# Patient Record
Sex: Female | Born: 1970 | Race: White | Hispanic: No | Marital: Married | Smoking: Never smoker
Health system: Southern US, Community
[De-identification: ages and names within clinical notes are randomized; demographics above are authoritative.]

## PROBLEM LIST (undated history)

## (undated) DIAGNOSIS — E079 Disorder of thyroid, unspecified: Secondary | ICD-10-CM

## (undated) DIAGNOSIS — M1990 Unspecified osteoarthritis, unspecified site: Secondary | ICD-10-CM

## (undated) HISTORY — PX: WRIST SURGERY: SHX841

## (undated) HISTORY — PX: TYMPANOSTOMY TUBE PLACEMENT: SHX32

## (undated) HISTORY — DX: Disorder of thyroid, unspecified: E07.9

## (undated) HISTORY — PX: DILATION AND CURETTAGE OF UTERUS: SHX78

---

## 1998-05-22 ENCOUNTER — Other Ambulatory Visit: Admission: RE | Admit: 1998-05-22 | Discharge: 1998-05-22 | Payer: Self-pay | Admitting: Gynecology

## 1999-06-20 ENCOUNTER — Other Ambulatory Visit: Admission: RE | Admit: 1999-06-20 | Discharge: 1999-06-20 | Payer: Self-pay | Admitting: Gynecology

## 1999-09-26 ENCOUNTER — Other Ambulatory Visit: Admission: RE | Admit: 1999-09-26 | Discharge: 1999-09-26 | Payer: Self-pay | Admitting: Gynecology

## 1999-12-25 ENCOUNTER — Encounter: Payer: Self-pay | Admitting: Family Medicine

## 1999-12-25 ENCOUNTER — Encounter: Admission: RE | Admit: 1999-12-25 | Discharge: 1999-12-25 | Payer: Self-pay | Admitting: Family Medicine

## 2000-05-27 ENCOUNTER — Other Ambulatory Visit: Admission: RE | Admit: 2000-05-27 | Discharge: 2000-05-27 | Payer: Self-pay | Admitting: Obstetrics and Gynecology

## 2001-08-03 ENCOUNTER — Other Ambulatory Visit: Admission: RE | Admit: 2001-08-03 | Discharge: 2001-08-03 | Payer: Self-pay | Admitting: Obstetrics and Gynecology

## 2002-08-24 ENCOUNTER — Other Ambulatory Visit: Admission: RE | Admit: 2002-08-24 | Discharge: 2002-08-24 | Payer: Self-pay | Admitting: Obstetrics and Gynecology

## 2003-09-04 ENCOUNTER — Other Ambulatory Visit: Admission: RE | Admit: 2003-09-04 | Discharge: 2003-09-04 | Payer: Self-pay | Admitting: Obstetrics and Gynecology

## 2004-01-04 ENCOUNTER — Other Ambulatory Visit: Admission: RE | Admit: 2004-01-04 | Discharge: 2004-01-04 | Payer: Self-pay | Admitting: Obstetrics and Gynecology

## 2004-09-02 ENCOUNTER — Inpatient Hospital Stay (HOSPITAL_COMMUNITY): Admission: AD | Admit: 2004-09-02 | Discharge: 2004-09-02 | Payer: Self-pay | Admitting: Obstetrics and Gynecology

## 2004-10-21 ENCOUNTER — Other Ambulatory Visit: Admission: RE | Admit: 2004-10-21 | Discharge: 2004-10-21 | Payer: Self-pay | Admitting: Obstetrics and Gynecology

## 2005-04-30 ENCOUNTER — Other Ambulatory Visit: Admission: RE | Admit: 2005-04-30 | Discharge: 2005-04-30 | Payer: Self-pay | Admitting: Obstetrics and Gynecology

## 2005-08-10 ENCOUNTER — Inpatient Hospital Stay (HOSPITAL_COMMUNITY): Admission: AD | Admit: 2005-08-10 | Discharge: 2005-08-10 | Payer: Self-pay | Admitting: Obstetrics and Gynecology

## 2005-08-11 ENCOUNTER — Inpatient Hospital Stay (HOSPITAL_COMMUNITY): Admission: AD | Admit: 2005-08-11 | Discharge: 2005-08-11 | Payer: Self-pay | Admitting: Obstetrics and Gynecology

## 2005-08-15 ENCOUNTER — Inpatient Hospital Stay (HOSPITAL_COMMUNITY): Admission: AD | Admit: 2005-08-15 | Discharge: 2005-08-16 | Payer: Self-pay | Admitting: Obstetrics and Gynecology

## 2005-10-26 ENCOUNTER — Inpatient Hospital Stay (HOSPITAL_COMMUNITY): Admission: AD | Admit: 2005-10-26 | Discharge: 2005-10-26 | Payer: Self-pay | Admitting: Obstetrics and Gynecology

## 2005-10-28 ENCOUNTER — Inpatient Hospital Stay (HOSPITAL_COMMUNITY): Admission: AD | Admit: 2005-10-28 | Discharge: 2005-10-28 | Payer: Self-pay | Admitting: Obstetrics and Gynecology

## 2005-11-05 ENCOUNTER — Inpatient Hospital Stay (HOSPITAL_COMMUNITY): Admission: AD | Admit: 2005-11-05 | Discharge: 2005-11-08 | Payer: Self-pay | Admitting: Obstetrics & Gynecology

## 2005-12-17 ENCOUNTER — Other Ambulatory Visit: Admission: RE | Admit: 2005-12-17 | Discharge: 2005-12-17 | Payer: Self-pay | Admitting: Obstetrics and Gynecology

## 2010-07-08 ENCOUNTER — Inpatient Hospital Stay (HOSPITAL_COMMUNITY)
Admission: AD | Admit: 2010-07-08 | Discharge: 2010-07-10 | Payer: Self-pay | Source: Home / Self Care | Admitting: Obstetrics and Gynecology

## 2010-12-26 LAB — CBC
HCT: 34.3 % — ABNORMAL LOW (ref 36.0–46.0)
HCT: 40.3 % (ref 36.0–46.0)
Hemoglobin: 12 g/dL (ref 12.0–15.0)
Hemoglobin: 13.6 g/dL (ref 12.0–15.0)
MCH: 32.1 pg (ref 26.0–34.0)
MCH: 33.6 pg (ref 26.0–34.0)
MCHC: 33.8 g/dL (ref 30.0–36.0)
MCHC: 34.9 g/dL (ref 30.0–36.0)
MCV: 95.1 fL (ref 78.0–100.0)
MCV: 96.3 fL (ref 78.0–100.0)
Platelets: 168 10*3/uL (ref 150–400)
Platelets: 179 10*3/uL (ref 150–400)
RBC: 3.57 MIL/uL — ABNORMAL LOW (ref 3.87–5.11)
RBC: 4.24 MIL/uL (ref 3.87–5.11)
RDW: 13.7 % (ref 11.5–15.5)
RDW: 13.8 % (ref 11.5–15.5)
WBC: 10 10*3/uL (ref 4.0–10.5)
WBC: 11.9 10*3/uL — ABNORMAL HIGH (ref 4.0–10.5)

## 2010-12-26 LAB — RPR: RPR Ser Ql: NONREACTIVE

## 2017-05-27 DIAGNOSIS — D225 Melanocytic nevi of trunk: Secondary | ICD-10-CM | POA: Diagnosis not present

## 2017-05-27 DIAGNOSIS — Z85828 Personal history of other malignant neoplasm of skin: Secondary | ICD-10-CM | POA: Diagnosis not present

## 2017-05-27 DIAGNOSIS — D1800 Hemangioma unspecified site: Secondary | ICD-10-CM | POA: Diagnosis not present

## 2017-06-29 DIAGNOSIS — M67432 Ganglion, left wrist: Secondary | ICD-10-CM | POA: Diagnosis not present

## 2017-07-06 DIAGNOSIS — M67432 Ganglion, left wrist: Secondary | ICD-10-CM | POA: Diagnosis not present

## 2017-07-29 DIAGNOSIS — Z23 Encounter for immunization: Secondary | ICD-10-CM | POA: Diagnosis not present

## 2017-10-26 DIAGNOSIS — Z01419 Encounter for gynecological examination (general) (routine) without abnormal findings: Secondary | ICD-10-CM | POA: Diagnosis not present

## 2017-10-26 DIAGNOSIS — Z1231 Encounter for screening mammogram for malignant neoplasm of breast: Secondary | ICD-10-CM | POA: Diagnosis not present

## 2017-10-26 DIAGNOSIS — Z6821 Body mass index (BMI) 21.0-21.9, adult: Secondary | ICD-10-CM | POA: Diagnosis not present

## 2017-12-30 DIAGNOSIS — M67432 Ganglion, left wrist: Secondary | ICD-10-CM | POA: Diagnosis not present

## 2017-12-31 ENCOUNTER — Other Ambulatory Visit: Payer: Self-pay | Admitting: Orthopedic Surgery

## 2018-02-25 ENCOUNTER — Ambulatory Visit (HOSPITAL_BASED_OUTPATIENT_CLINIC_OR_DEPARTMENT_OTHER): Admit: 2018-02-25 | Payer: Self-pay | Admitting: Orthopedic Surgery

## 2018-02-25 ENCOUNTER — Encounter (HOSPITAL_BASED_OUTPATIENT_CLINIC_OR_DEPARTMENT_OTHER): Payer: Self-pay

## 2018-02-25 SURGERY — EXCISION, GANGLION CYST, WRIST
Anesthesia: Choice | Laterality: Left

## 2018-05-25 DIAGNOSIS — D1801 Hemangioma of skin and subcutaneous tissue: Secondary | ICD-10-CM | POA: Diagnosis not present

## 2018-05-25 DIAGNOSIS — I781 Nevus, non-neoplastic: Secondary | ICD-10-CM | POA: Diagnosis not present

## 2018-05-25 DIAGNOSIS — Z85828 Personal history of other malignant neoplasm of skin: Secondary | ICD-10-CM | POA: Diagnosis not present

## 2018-06-18 ENCOUNTER — Ambulatory Visit (INDEPENDENT_AMBULATORY_CARE_PROVIDER_SITE_OTHER): Payer: BLUE CROSS/BLUE SHIELD

## 2018-06-18 ENCOUNTER — Ambulatory Visit (INDEPENDENT_AMBULATORY_CARE_PROVIDER_SITE_OTHER): Payer: BLUE CROSS/BLUE SHIELD | Admitting: Sports Medicine

## 2018-06-18 ENCOUNTER — Encounter: Payer: Self-pay | Admitting: Sports Medicine

## 2018-06-18 ENCOUNTER — Ambulatory Visit: Payer: Self-pay

## 2018-06-18 VITALS — BP 120/82 | HR 62 | Ht 64.0 in | Wt 127.6 lb

## 2018-06-18 DIAGNOSIS — M25551 Pain in right hip: Secondary | ICD-10-CM | POA: Diagnosis not present

## 2018-06-18 MED ORDER — IBUPROFEN-FAMOTIDINE 800-26.6 MG PO TABS: 1.0000 | ORAL_TABLET | Freq: Three times a day (TID) | ORAL | 2 refills | Status: DC | PRN

## 2018-06-18 MED ORDER — IBUPROFEN-FAMOTIDINE 800-26.6 MG PO TABS: 1.0000 | ORAL_TABLET | Freq: Three times a day (TID) | ORAL | 0 refills | Status: DC | PRN

## 2018-06-18 NOTE — Patient Instructions (Addendum)
We are ordering an MRI for you today.  The imaging office will be calling you to schedule your appointment after we obtain authorization from your insurance company.   Please be sure you have signed up for MyChart so that we can get your results to you.  We will be in touch with you as soon as we can.  Please know, it can take up to 3-4 business days for the radiologist and Dr. Rigby to have time to review the results and determine the best appropriate action.  If there is something that appears to be surgical or needs a referral to other specialists we will let you know through MyChart or telephone.  Otherwise we will plan to schedule a follow up appointment with Dr. Rigby once we have the results.    [facility]: [phone]  
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
## 2018-06-21 ENCOUNTER — Telehealth: Payer: Self-pay

## 2018-06-21 NOTE — Telephone Encounter (Signed)
Pt called and left VM asking about MR arthrogram, she has not heard back about scheduling.   I left VM at [facility] Friday and have not heard back. -BSC.

## 2018-06-21 NOTE — Telephone Encounter (Signed)
Spoke with Jacqueline Stout at [facility], they can schedule for next Monday. Called pt and advised. She will await call from [place].

## 2018-06-28 ENCOUNTER — Ambulatory Visit (INDEPENDENT_AMBULATORY_CARE_PROVIDER_SITE_OTHER): Payer: BLUE CROSS/BLUE SHIELD

## 2018-06-28 ENCOUNTER — Ambulatory Visit (INDEPENDENT_AMBULATORY_CARE_PROVIDER_SITE_OTHER): Payer: BLUE CROSS/BLUE SHIELD | Admitting: Sports Medicine

## 2018-06-28 DIAGNOSIS — M25551 Pain in right hip: Secondary | ICD-10-CM

## 2018-06-28 DIAGNOSIS — M12851 Other specific arthropathies, not elsewhere classified, right hip: Secondary | ICD-10-CM | POA: Diagnosis not present

## 2018-06-28 MED ORDER — GADOBENATE DIMEGLUMINE 529 MG/ML IV SOLN
5.0000 mL | Freq: Once | INTRAVENOUS | Status: AC | PRN
  Administered 2018-06-28: 1 mL via INTRA_ARTICULAR

## 2018-06-28 NOTE — Progress Notes (Signed)
   Procedure: Real-time Ultrasound Guided gadolinium contrast injection of right femoroacetabular joint Device: GE Logiq E  Verbal informed consent obtained.  Time-out conducted.  Noted no overlying erythema, induration, or other signs of local infection.  Skin prepped in a sterile fashion.  Local anesthesia: Topical Ethyl chloride.  With sterile technique and under real time ultrasound guidance: Using a 22-gauge spinal needle advanced to the femoral head/neck junction, contacted bone and injected 2 cc lidocaine, 2 cc bupivacaine, syringe switched and 0.1 cc gadolinium injected, syringe again switched and 5 cc Isovue injected, syringe switched again and 8 mL of sterile saline injected to flush the needle. Joint visualized and capsule seen distending confirming intra-articular placement of contrast material and medication. Completed without difficulty  Advised to call if fevers/chills, erythema, induration, drainage, or persistent bleeding.  Images permanently stored and available for review in the ultrasound unit.  Impression: Technically successful ultrasound guided gadolinium contrast injection for MR arthrography.  Please see separate MR arthrogram report.

## 2018-06-28 NOTE — Assessment & Plan Note (Signed)
Injection procedure as above for MR arthrogram. Further management per primary treating provider.

## 2018-06-29 ENCOUNTER — Encounter: Payer: Self-pay | Admitting: Sports Medicine

## 2018-06-29 DIAGNOSIS — E039 Hypothyroidism, unspecified: Secondary | ICD-10-CM | POA: Insufficient documentation

## 2018-07-01 ENCOUNTER — Encounter: Payer: Self-pay | Admitting: Sports Medicine

## 2018-07-01 ENCOUNTER — Ambulatory Visit: Payer: BLUE CROSS/BLUE SHIELD | Admitting: Sports Medicine

## 2018-07-01 VITALS — BP 120/70 | HR 77 | Ht 64.0 in | Wt 126.6 lb

## 2018-07-01 DIAGNOSIS — M25551 Pain in right hip: Secondary | ICD-10-CM | POA: Diagnosis not present

## 2018-07-01 DIAGNOSIS — M84359A Stress fracture, hip, unspecified, initial encounter for fracture: Secondary | ICD-10-CM

## 2018-07-01 NOTE — Progress Notes (Signed)
See office visit note.  She is nonweightbearing on crutches.
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
## 2018-07-15 ENCOUNTER — Ambulatory Visit: Payer: BLUE CROSS/BLUE SHIELD | Admitting: Sports Medicine

## 2018-07-15 ENCOUNTER — Encounter: Payer: Self-pay | Admitting: Sports Medicine

## 2018-07-15 VITALS — BP 110/80 | HR 66 | Ht 64.0 in | Wt 128.8 lb

## 2018-07-15 DIAGNOSIS — M84359A Stress fracture, hip, unspecified, initial encounter for fracture: Secondary | ICD-10-CM | POA: Diagnosis not present

## 2018-07-15 DIAGNOSIS — M25551 Pain in right hip: Secondary | ICD-10-CM

## 2018-07-15 NOTE — Patient Instructions (Signed)
Vitamin D3, 5000 units daily is typically safe to take. If you take it long term it is recommended to check your levels to ensure you remain in a therapeutic range. Be good to make sure you are getting up to 1500 mg of calcium per day.  One Tums has 500 mg of calcium carbonate which equals approximately 200 mg of dietary calcium. It would probably be good to take 2-4 tablets on a daily basis in addition to the vitamin D3.
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
## 2018-07-28 DIAGNOSIS — Z23 Encounter for immunization: Secondary | ICD-10-CM | POA: Diagnosis not present

## 2018-07-29 ENCOUNTER — Encounter: Payer: Self-pay | Admitting: Sports Medicine

## 2018-07-29 ENCOUNTER — Ambulatory Visit: Payer: BLUE CROSS/BLUE SHIELD | Admitting: Sports Medicine

## 2018-07-29 ENCOUNTER — Ambulatory Visit (INDEPENDENT_AMBULATORY_CARE_PROVIDER_SITE_OTHER): Payer: BLUE CROSS/BLUE SHIELD

## 2018-07-29 VITALS — BP 118/78 | HR 66 | Ht 64.0 in | Wt 128.8 lb

## 2018-07-29 DIAGNOSIS — M25551 Pain in right hip: Secondary | ICD-10-CM

## 2018-07-29 DIAGNOSIS — M1611 Unilateral primary osteoarthritis, right hip: Secondary | ICD-10-CM | POA: Diagnosis not present

## 2018-07-29 DIAGNOSIS — M84359A Stress fracture, hip, unspecified, initial encounter for fracture: Secondary | ICD-10-CM | POA: Diagnosis not present
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
## 2018-07-29 NOTE — Assessment & Plan Note (Signed)
She is doing significantly better.  No pain with day-to-day activities while using crutches.  We will have her transition off the crutches and continue with minimal activity over the next 4 weeks.  Will plan to repeat x-rays at follow-up as well as consider further repeat advanced diagnostic imaging with MRI of the hip either at the 8 or 12-week mark if any symptoms

## 2018-08-26 ENCOUNTER — Encounter: Payer: Self-pay | Admitting: Sports Medicine

## 2018-08-26 ENCOUNTER — Ambulatory Visit: Payer: BLUE CROSS/BLUE SHIELD | Admitting: Sports Medicine

## 2018-08-26 ENCOUNTER — Ambulatory Visit (INDEPENDENT_AMBULATORY_CARE_PROVIDER_SITE_OTHER): Payer: BLUE CROSS/BLUE SHIELD

## 2018-08-26 VITALS — BP 122/80 | HR 66 | Ht 64.0 in | Wt 129.0 lb

## 2018-08-26 DIAGNOSIS — M84351A Stress fracture, right femur, initial encounter for fracture: Secondary | ICD-10-CM

## 2018-08-26 DIAGNOSIS — M1611 Unilateral primary osteoarthritis, right hip: Secondary | ICD-10-CM | POA: Diagnosis not present

## 2018-08-26 NOTE — Progress Notes (Signed)
PROCEDURE NOTE: THERAPEUTIC EXERCISES (97110) 15 minutes spent for Therapeutic exercises as below and as referenced in the AVS.  This included exercises focusing on stretching, strengthening, with significant focus on eccentric aspects.   Proper technique shown and discussed handout in great detail with ATC.  All questions were discussed and answered.   Long term goals include an improvement in range of motion, strength, endurance as well as avoiding reinjury. Frequency of visits is one time as determined during today's  office visit. Frequency of exercises to be performed is as per handout.  EXERCISES REVIEWED:  Jacqueline KayGoodman  Bird-dog  Hip flexor stretching

## 2018-08-26 NOTE — Patient Instructions (Addendum)
Also check out [employer]"Foundation Training" which is a program developed by Dr. Myles LippsEric Goodman.   There are links to a couple of his YouTube Videos below and I would like to see performing one of his videos 5-6 days per week.    A good intro video is: "Independence from Pain 7-minute Video" - [email or web address]    Exercises that focus more on the neck are as below: Dr. Derrill KayGoodman with [employer] teaching neck and shoulder details Part 1 - [email or web address] Part 2 Dr. Derrill KayGoodman with [facility] quick routine to practice daily - [email or web address]://[email or web address]  Do not try to attempt the entire video when first beginning.    Try breaking of each exercise that he goes into shorter segments.  Otherwise if they perform an exercise for 45 seconds, start with 15 seconds and rest and then resume when they begin the new activity.  If you work your way up to being able to do these videos without having to stop, I expect you will see significant improvements in your pain.  If you enjoy his videos and would like to find out more you can look on his website: [email or web address].  He has a workout streaming option as well as a DVD set available for purchase.  Amazon has the best price for his DVDs.     Please perform the exercise program that we have prepared for you and gone over in detail on a daily basis.  In addition to the handout you were provided you can access your program through: [email or web address]   Your unique program code is:  [email or web address]
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
## 2018-08-28 ENCOUNTER — Encounter: Payer: Self-pay | Admitting: Sports Medicine

## 2018-09-23 ENCOUNTER — Ambulatory Visit: Payer: BLUE CROSS/BLUE SHIELD | Admitting: Sports Medicine

## 2018-09-27 ENCOUNTER — Ambulatory Visit (INDEPENDENT_AMBULATORY_CARE_PROVIDER_SITE_OTHER): Payer: BLUE CROSS/BLUE SHIELD

## 2018-09-27 ENCOUNTER — Encounter: Payer: Self-pay | Admitting: Sports Medicine

## 2018-09-27 ENCOUNTER — Ambulatory Visit: Payer: BLUE CROSS/BLUE SHIELD | Admitting: Sports Medicine

## 2018-09-27 VITALS — BP 122/84 | HR 65 | Ht 64.0 in | Wt 131.2 lb

## 2018-09-27 DIAGNOSIS — M1611 Unilateral primary osteoarthritis, right hip: Secondary | ICD-10-CM | POA: Diagnosis not present

## 2018-09-27 DIAGNOSIS — M84351A Stress fracture, right femur, initial encounter for fracture: Secondary | ICD-10-CM

## 2018-09-27 NOTE — Progress Notes (Signed)
Jacqueline FellsMichael D. Delorise Shinerigby, DO  [facility] [place] [phone]  Jacqueline SheenCynthia C Stout - 47 y.o. female MRN [id number]  Date of birth: [birth year]  Visit Date:   PCP: No primary care provider on file.   Referred by: No ref. provider found   SUBJECTIVE:  Chief Complaint  Patient presents with  . f/u R hip stress fx    Doing HEP every other day. No meds for pain, taking Ca + D.     HPI: Patient is here for a follow-up of her right hip pain.  She was diagnosed with stress fracture after an MRI on 06/28/2018.  She was not weightbearing on crutches for several weeks and subsequently been able to wean off of these and has been doing quite well.  She continues to have intermittent days of moderate pain interspersed with pain-free days.  She cannot relate this to activity levels or any other situational or environmental changes such as weather changing or sleep patterns.  She is taking the calcium and vitamin D supplements but is not taking any other medications on a regular basis.  She has been performing foundations exercises as well as pelvic recruitment exercises.  She does have a small amount of pain localized into the groin with bird-dog activities.  She has been to ride her bike without any flareup of pain.  She feels that she cannot likely walk a mile without exacerbation in her symptoms.  REVIEW OF SYSTEMS: She denies any fevers, chills or night sweats.  She is no longer having any significant sleep disturbances.  HISTORY:  Prior history reviewed and updated per electronic medical record.  Social History   Occupational History  . Not on file  Tobacco Use  . Smoking status: Never Smoker  . Smokeless tobacco: Never Used  Substance and Sexual Activity  . Alcohol use: Yes    Comment: occasionally  . Drug use: Not on file  . Sexual activity: Not on file   Social History   Social History Narrative  . Not on file      DATA OBTAINED & REVIEWED:    No results for input(s): HGBA1C, LABURIC, CREATINE, CALCIUM, AST, ALT, TSH in the last 8760 hours.  Invalid input(s): MAGNESIUM, CK No problems updated. No specialty comments available.  OBJECTIVE:  VS:  HT:5\' 4"  (162.6 cm)   WT:131 lb 3.2 oz (59.5 kg)  BMI:22.51    BP:122/84  HR:65bpm  TEMP: ( )  RESP:98 %   PHYSICAL EXAM: Bilateral hip exam is overall well aligned good internal and external motion of both hips.  She has a small amount of stiffness on the right side but this does loosen up with repeat maneuvering.  Hip strength with hip adduction, abduction, flexion and extension is 5/5.  Small amount of pain localizing to the groin with terminal FADIR testing.  No pain with axial load and circumduction.  X-rays reviewed today that show no changes although there does appear to be a small amount of sclerosis along the acetabular cyst previously noted   ASSESSMENT  1. Stress fracture of right hip     PLAN:  Pertinent additional documentation may be included in corresponding procedure notes, imaging studies, problem based documentation and patient instructions.  Procedures:  None  Medications:  No orders of the defined types were placed in this encounter.  Discussion/Instructions: No problem-specific Assessment & Plan notes found for this encounter. THERAPEUTIC EXERCISE: Discussed the foundation of treatment for this condition is physical  therapy and/or daily (5-6 days/week) therapeutic exercises, focusing on core strengthening, coordination, neuromuscular control/reeducation.  Continue previously prescribed home exercise program.  Discussed appropriate use of both heat and ice with the patient today.  Discussed red flag symptoms that warrant earlier emergent evaluation and patient voices understanding. Activity modifications and the importance of avoiding exacerbating activities (limiting pain to no more than a 4 / 10 during or following activity) recommended and  discussed. Overall she does seem improved.  I would like for her to start increasing her activities as she can tolerate slowly increasing walking initially.  No jogging at this time.  Some of the hip pain that she is likely experiencing is coming from some notes and home exercises as well as increased activity should with this.  If she does have any exacerbation of pain could consider repeat MRI without contrast of the right hip to see if the previous bony edema appreciated has improved.  I would prefer to try to avoid intra-articular injection if at all possible but if she does have ongoing generalized stiffness this can be considered.  She will send [place] a my chart message if any worsening symptoms and we can consider MRI at that time. If any lack of improvement: consider referral to Physical Therapy   Return in about 6 weeks (around 11/08/2018) for repeat clinical exam.          [name], DO    [place] [facility] Physician

## 2018-11-03 DIAGNOSIS — Z01419 Encounter for gynecological examination (general) (routine) without abnormal findings: Secondary | ICD-10-CM | POA: Diagnosis not present

## 2018-11-03 DIAGNOSIS — Z1231 Encounter for screening mammogram for malignant neoplasm of breast: Secondary | ICD-10-CM | POA: Diagnosis not present

## 2018-11-03 DIAGNOSIS — Z6822 Body mass index (BMI) 22.0-22.9, adult: Secondary | ICD-10-CM | POA: Diagnosis not present

## 2018-11-08 ENCOUNTER — Encounter: Payer: Self-pay | Admitting: Sports Medicine

## 2018-11-08 ENCOUNTER — Ambulatory Visit: Payer: BLUE CROSS/BLUE SHIELD | Admitting: Sports Medicine

## 2018-11-08 VITALS — BP 112/76 | HR 72 | Ht 64.0 in | Wt 131.8 lb

## 2018-11-08 DIAGNOSIS — M25551 Pain in right hip: Secondary | ICD-10-CM | POA: Diagnosis not present

## 2018-11-08 DIAGNOSIS — M84351A Stress fracture, right femur, initial encounter for fracture: Secondary | ICD-10-CM | POA: Diagnosis not present

## 2018-11-08 MED ORDER — TRAMADOL HCL 50 MG PO TABS: 50.0000 mg | ORAL_TABLET | Freq: Four times a day (QID) | ORAL | 0 refills | Status: AC | PRN

## 2018-11-08 MED ORDER — TRAMADOL HCL 50 MG PO TABS: 50.0000 mg | ORAL_TABLET | Freq: Four times a day (QID) | ORAL | 0 refills | Status: DC | PRN

## 2018-11-08 NOTE — Progress Notes (Signed)
Jacqueline Stout. Jacqueline Stout [phone]  [name] - 48 y.o. female MRN [id number]  Date of birth: 19-Feb-1971  Visit Date: November 08, 2018  PCP: No primary care provider on file.   Referred by: No ref. provider found  SUBJECTIVE:  Chief Complaint  Patient presents with  . Follow-up    R hip stress fx.  Worsening pain since last visit.  R hip XR- 09/27/18.  R hip MRI- 06/28/18.    HPI: Patient reports worsening symptoms since last visit.  She has a complicated history with a geode as well as acetabular stress fracture of the right hip.  She was nonweightbearing and progressively was able to return to normal day-to-day activities without any significant symptoms however over the past several weeks she has began to casually walk up to a mile at a time and ride her exercise bike gently with up to 8 out of 10 pain. This is keeping her awake at night due to this.  She previously is tried anti-inflammatories with no significant improvement.  She has been performing home therapeutic exercises including pelvic tilting and bird-dog and she does have pain with any type of straight leg raise.  REVIEW OF SYSTEMS: Per HPI   HISTORY:  Prior history reviewed and updated per electronic medical record.  Social History   Occupational History  . Not on file  Tobacco Use  . Smoking status: Never Smoker  . Smokeless tobacco: Never Used  Substance and Sexual Activity  . Alcohol use: Yes    Comment: occasionally  . Drug use: Not on file  . Sexual activity: Not on file   Social History   Social History Narrative  . Not on file    OBJECTIVE:  VS:  HT:5\' 4"  (162.6 cm)   WT:131 lb 12.8 oz (59.8 kg)  BMI:22.61    BP:112/76  HR:72bpm  TEMP: ( )  RESP:98 %   PHYSICAL EXAM: Adult female.  No acute distress.  Alert and appropriate. Right hip has marked pain with logroll as well as FADIR and Faber testing.  Markedly positive  Stinchfield test.  MRI from 06/28/2018 as well as follow-up subsequent x-rays do show a cortical irregularity within the right femoral head with associated osteochondral lesion 9 mm in diameter and associated marrow edema.  More advanced articular cartilage thinning and the right hip compared to conventional radiographs.  There is a notable geode within the right acetabulum considerable surrounding marrow edema with an osteochondral lesion of the right femoral head   ASSESSMENT  1. Right hip pain   2. Stress fracture of right hip      PROCEDURES:  None  PLAN:  Pertinent additional documentation may be included in corresponding procedure notes, imaging studies, problem based documentation and patient instructions.  >50% of this 25 minute visit spent in direct patient counseling and/or coordination of care.  Discussion was focused on education regarding the in discussing the pathoetiology and anticipated clinical course of the above condition.  Ultimately she is had a significant return of her symptoms with only minimal normal day-to-day activities.  This is multifactorial in nature but she does report it seems as though it is identical to the pain that she was having when the MRI was obtained that was concerning more so for a stress reaction.  I did discuss this case with Dr. Magnus Ivan back in September after the MRI was obtained and we discussed options at that time.  Injection therapy was not felt to be of high utility and I am hesitant to do this given the significant marrow edema that was previously in the acetabulum.  Given the degenerative changes that are present I feel as though total hip arthroplasty is the most prudent course of action at this time given the lack of improvement over the past 4 months.  She is having significant day-to-day activity limitations and nighttime disturbances due to this and has failed conservative measures including anti-inflammatories, physician guided exercise  program and nonweightbearing rest.   Activity modifications and the importance of avoiding exacerbating activities (limiting pain to no more than a 4 / 10 during or following activity) recommended and discussed.  Discussed red flag symptoms that warrant earlier emergent evaluation and patient voices understanding.   Meds ordered this encounter  Medications  . DISCONTD: traMADol (ULTRAM) 50 MG tablet    Sig: Take 1 tablet (50 mg total) by mouth every 6 (six) hours as needed for up to 5 days for moderate pain.    Dispense:  20 tablet    Refill:  0  . traMADol (ULTRAM) 50 MG tablet    Sig: Take 1 tablet (50 mg total) by mouth every 6 (six) hours as needed for up to 5 days for moderate pain.    Dispense:  20 tablet    Refill:  0   Lab Orders  No laboratory test(s) ordered today   Imaging Orders  No imaging studies ordered today    Referral Orders     Ambulatory referral to Orthopedic Surgery  Return if symptoms worsen or fail to improve.          Andrena MewsMichael D Teaghan Formica, DO    [facility]

## 2018-11-10 ENCOUNTER — Ambulatory Visit (INDEPENDENT_AMBULATORY_CARE_PROVIDER_SITE_OTHER): Payer: BLUE CROSS/BLUE SHIELD | Admitting: Orthopaedic Surgery

## 2018-11-10 VITALS — Ht 64.0 in | Wt 131.8 lb

## 2018-11-10 DIAGNOSIS — M1611 Unilateral primary osteoarthritis, right hip: Secondary | ICD-10-CM

## 2018-11-10 NOTE — Progress Notes (Signed)
Office Visit Note   Patient: Jacqueline Stout           Date of Birth: 1971/06/07           MRN: [id number] Visit Date: 11/10/2018              Requested by: Andrena Mewsigby, Michael D, DO [address] [place], [place] [address] PCP: System, Pcp Not In   Assessment & Plan: Visit Diagnoses:  1. Unilateral primary osteoarthritis, right hip     Plan: Unfortunately there are not a lot of treatment options for this.  Arthroscopic intervention would not help at all.  At this point a steroid injection would not really do a lot for her.  It could temporize her symptoms.  The concern is the moderate arthritic changes were seen in her hip with a large cyst in the acetabular roof and edematous changes there and the osteochondral defect in the femoral head with some edematous changes.  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  At this point 1 option would be hip replacement surgery.  I talked her about this in detail and showed her hip model and went over x-rays.  We discussed in detail what the risk and benefits of the surgery are as well as what her intraoperative and postoperative course involve.  All questions and concerns were answered and addressed.  She is very interested in having this performed in the near future.  Follow-Up Instructions: Return for 2 weeks post-op.   Orders:  No orders of the defined types were placed in this encounter.  No orders of the defined types were placed in this encounter.     Procedures: No procedures performed   Clinical Data: No additional findings.   Subjective: Chief Complaint  Patient presents with  . Right Hip - Pain  The patient is a very pleasant and active 48 year old runner who is been having slowly worsening right hip pain for the last several years but got significantly worse this past summer.  At this point her pain can be 10 out of 10 with activities.  It hurts in her right groin.  It is  detrimentally affected her mobility and her quality of life.  She has problems crossing her leg on that side as well.  She was actually seen extensively by Dr. Gaspar BiddingMichael Rigby who then sent her our way.  She has had an MRI of her right hip for my review and she is had recent plain films as well.  At this point she is tried and failed all forms of conservative treatment measures and she would like to talk about the possibility of hip replacement surgery.  She is a very petite and active 48 year old female and is not a diabetic.  HPI  Review of Systems She currently denies any headache, chest pain, shortness of breath, fever, chills, nausea, vomiting  Objective: Vital Signs: Ht 5\' 4"  (1.626 m)   Wt 131 lb 12.8 oz (59.8 kg)   LMP 11/08/2018   BMI 22.62 kg/m   Physical Exam She is alert and orient x3 and in no acute distress Ortho Exam Examination of her right hip shows significant stiffness.  There is pain in the groin with internal and external rotation on the right hip.  She does not cross her leg well on that side.  Her left hip exam is entirely normal and she is very mobile on her left hip. Specialty Comments:  No specialty  comments available.  Imaging: No results found. The MRI and plain films of her right hip are independently reviewed.  On the plain x-rays, you can see that she has had evidence of femoral acetabular impingement.  There is actually flattening of the femoral head and you can see cystic changes in the acetabulum.  On the lateral view of the femoral head there is also a significant buildup of bone around the superior lateral femoral neck suggesting a pincer effect from femoral acetabular impingement.  The MRI does show mild to moderate arthritic changes in the hip.  There is an osteochondral defect of the femoral head and there is cystic changes and edema in the roof of the acetabulum.  There is also some degenerative fraying of the acetabular labrum.  There is no evidence of  fracture.  PMFS History: Patient Active Problem List   Diagnosis Date Noted  . Unilateral primary osteoarthritis, right hip 11/10/2018  . Stress fracture of right hip 08/26/2018  . Stress fracture of hip 07/01/2018  . Hypothyroidism 06/29/2018  . Right hip pain 06/28/2018   No past medical history on file.  No family history on file.  No past surgical history on file. Social History   Occupational History  . Not on file  Tobacco Use  . Smoking status: Never Smoker  . Smokeless tobacco: Never Used  Substance and Sexual Activity  . Alcohol use: Yes    Comment: occasionally  . Drug use: Not on file  . Sexual activity: Not on file

## 2018-11-30 ENCOUNTER — Other Ambulatory Visit (INDEPENDENT_AMBULATORY_CARE_PROVIDER_SITE_OTHER): Payer: Self-pay | Admitting: Physician Assistant

## 2018-12-06 NOTE — Patient Instructions (Signed)
Jacqueline Stout  12/06/2018   Your procedure is scheduled on: Friday 12/10/2018  Report to [facility] Main  Entrance              Report to admitting at  0600  AM    Call this number if you have problems the morning of surgery [phone]    Remember: Do not eat food or drink liquids :After Midnight.             BRUSH YOUR TEETH MORNING OF SURGERY AND RINSE YOUR MOUTH OUT, NO CHEWING GUM CANDY OR MINTS.     Take these medicines the morning of surgery with A SIP OF WATER: Levothyroxine (Synthroid)                                  You may not have any metal on your body including hair pins and              piercings  Do not wear jewelry, make-up, lotions, powders or perfumes, deodorant             Do not wear nail polish.  Do not shave  48 hours prior to surgery.            Do not bring valuables to the hospital. [facility] IS NOT             RESPONSIBLE   FOR VALUABLES.  Contacts, dentures or bridgework may not be worn into surgery.  Leave suitcase in the car. After surgery it may be brought to your room.                Please read over the following fact sheets you were given: _____________________________________________________________________             [facility] - Preparing for Surgery Before surgery, you can play an important role.  Because skin is not sterile, your skin needs to be as free of germs as possible.  You can reduce the number of germs on your skin by washing with CHG (chlorahexidine gluconate) soap before surgery.  CHG is an antiseptic cleaner which kills germs and bonds with the skin to continue killing germs even after washing. Please DO NOT use if you have an allergy to CHG or antibacterial soaps.  If your skin becomes reddened/irritated stop using the CHG and inform your nurse when you arrive at Short Stay. Do not shave (including legs and underarms) for at least 48 hours prior to the first CHG shower.  You may shave your  face/neck. Please follow these instructions carefully:  1.  Shower with CHG Soap the night before surgery and the  morning of Surgery.  2.  If you choose to wash your hair, wash your hair first as usual with your  normal  shampoo.  3.  After you shampoo, rinse your hair and body thoroughly to remove the  shampoo.                           4.  Use CHG as you would any other liquid soap.  You can apply chg directly  to the skin and wash                       Gently with a  scrungie or clean washcloth.  5.  Apply the CHG Soap to your body ONLY FROM THE NECK DOWN.   Do not use on face/ open                           Wound or open sores. Avoid contact with eyes, ears mouth and genitals (private parts).                       Wash face,  Genitals (private parts) with your normal soap.             6.  Wash thoroughly, paying special attention to the area where your surgery  will be performed.  7.  Thoroughly rinse your body with warm water from the neck down.  8.  DO NOT shower/wash with your normal soap after using and rinsing off  the CHG Soap.                9.  Pat yourself dry with a clean towel.            10.  Wear clean pajamas.            11.  Place clean sheets on your bed the night of your first shower and do not  sleep with pets. Day of Surgery : Do not apply any lotions/deodorants the morning of surgery.  Please wear clean clothes to the hospital/surgery center.  FAILURE TO FOLLOW THESE INSTRUCTIONS MAY RESULT IN THE CANCELLATION OF YOUR SURGERY PATIENT SIGNATURE_________________________________  NURSE SIGNATURE__________________________________  ________________________________________________________________________   Adam Phenix  An incentive spirometer is a tool that can help keep your lungs clear and active. This tool measures how well you are filling your lungs with each breath. Taking long deep breaths may help reverse or decrease the chance of developing breathing  (pulmonary) problems (especially infection) following:  A long period of time when you are unable to move or be active. BEFORE THE PROCEDURE   If the spirometer includes an indicator to show your best effort, your nurse or respiratory therapist will set it to a desired goal.  If possible, sit up straight or lean slightly forward. Try not to slouch.  Hold the incentive spirometer in an upright position. INSTRUCTIONS FOR USE  1. Sit on the edge of your bed if possible, or sit up as far as you can in bed or on a chair. 2. Hold the incentive spirometer in an upright position. 3. Breathe out normally. 4. Place the mouthpiece in your mouth and seal your lips tightly around it. 5. Breathe in slowly and as deeply as possible, raising the piston or the ball toward the top of the column. 6. Hold your breath for 3-5 seconds or for as long as possible. Allow the piston or ball to fall to the bottom of the column. 7. Remove the mouthpiece from your mouth and breathe out normally. 8. Rest for a few seconds and repeat Steps 1 through 7 at least 10 times every 1-2 hours when you are awake. Take your time and take a few normal breaths between deep breaths. 9. The spirometer may include an indicator to show your best effort. Use the indicator as a goal to work toward during each repetition. 10. After each set of 10 deep breaths, practice coughing to be sure your lungs are clear. If you have an incision (the cut made at the time of surgery), support your incision  when coughing by placing a pillow or rolled up towels firmly against it. Once you are able to get out of bed, walk around indoors and cough well. You may stop using the incentive spirometer when instructed by your caregiver.  RISKS AND COMPLICATIONS  Take your time so you do not get dizzy or light-headed.  If you are in pain, you may need to take or ask for pain medication before doing incentive spirometry. It is harder to take a deep breath if you  are having pain. AFTER USE  Rest and breathe slowly and easily.  It can be helpful to keep track of a log of your progress. Your caregiver can provide you with a simple table to help with this. If you are using the spirometer at home, follow these instructions: [place] IF:   You are having difficultly using the spirometer.  You have trouble using the spirometer as often as instructed.  Your pain medication is not giving enough relief while using the spirometer.  You develop fever of 100.5 F (38.1 C) or higher. SEEK IMMEDIATE MEDICAL CARE IF:   You cough up bloody sputum that had not been present before.  You develop fever of 102 F (38.9 C) or greater.  You develop worsening pain at or near the incision site. MAKE SURE YOU:   Understand these instructions.  Will watch your condition.  Will get help right away if you are not doing well or get worse. Document Released: 02/09/2007 Document Revised: 12/22/2011 Document Reviewed: 04/12/2007 [facility] Patient Information 2014 [place], [place].   ________________________________________________________________________

## 2018-12-07 ENCOUNTER — Other Ambulatory Visit: Payer: Self-pay

## 2018-12-07 ENCOUNTER — Encounter (HOSPITAL_COMMUNITY)
Admission: RE | Admit: 2018-12-07 | Discharge: 2018-12-07 | Disposition: A | Discharge status: Home / Self Care | Payer: BLUE CROSS/BLUE SHIELD | Source: Ambulatory Visit | Attending: Orthopaedic Surgery | Admitting: Orthopaedic Surgery

## 2018-12-07 ENCOUNTER — Encounter (HOSPITAL_COMMUNITY): Payer: Self-pay

## 2018-12-07 DIAGNOSIS — Z01812 Encounter for preprocedural laboratory examination: Secondary | ICD-10-CM | POA: Diagnosis not present

## 2018-12-07 DIAGNOSIS — M1611 Unilateral primary osteoarthritis, right hip: Secondary | ICD-10-CM | POA: Diagnosis not present

## 2018-12-07 HISTORY — DX: Unspecified osteoarthritis, unspecified site: M19.90

## 2018-12-07 LAB — SURGICAL PCR SCREEN
MRSA, PCR: NEGATIVE
STAPHYLOCOCCUS AUREUS: NEGATIVE

## 2018-12-07 LAB — BASIC METABOLIC PANEL
Anion gap: 5 (ref 5–15)
BUN: 14 mg/dL (ref 6–20)
CO2: 28 mmol/L (ref 22–32)
Calcium: 9 mg/dL (ref 8.9–10.3)
Chloride: 107 mmol/L (ref 98–111)
Creatinine, Ser: 0.56 mg/dL (ref 0.44–1.00)
GFR calc Af Amer: >60 mL/min (ref >60)
GFR calc non Af Amer: >60 mL/min (ref >60)
Glucose, Bld: 77 mg/dL (ref 70–99)
Potassium: 4.3 mmol/L (ref 3.5–5.1)
Sodium: 140 mmol/L (ref 135–145)

## 2018-12-07 LAB — CBC
HCT: 40.2 % (ref 36.0–46.0)
Hemoglobin: 13 g/dL (ref 12.0–15.0)
MCH: 30.4 pg (ref 26.0–34.0)
MCHC: 32.3 g/dL (ref 30.0–36.0)
MCV: 93.9 fL (ref 80.0–100.0)
Platelets: 203 10*3/uL (ref 150–400)
RBC: 4.28 MIL/uL (ref 3.87–5.11)
RDW: 12 % (ref 11.5–15.5)
WBC: 4.1 10*3/uL (ref 4.0–10.5)
nRBC: 0 % (ref 0.0–0.2)

## 2018-12-07 LAB — HCG, SERUM, QUALITATIVE: Preg, Serum: NEGATIVE

## 2018-12-10 ENCOUNTER — Observation Stay (HOSPITAL_COMMUNITY): Payer: BLUE CROSS/BLUE SHIELD

## 2018-12-10 ENCOUNTER — Inpatient Hospital Stay (HOSPITAL_COMMUNITY): Payer: BLUE CROSS/BLUE SHIELD | Admitting: Physician Assistant

## 2018-12-10 ENCOUNTER — Encounter (HOSPITAL_COMMUNITY): Payer: Self-pay | Admitting: Certified Registered Nurse Anesthetist

## 2018-12-10 ENCOUNTER — Other Ambulatory Visit: Payer: Self-pay

## 2018-12-10 ENCOUNTER — Inpatient Hospital Stay (HOSPITAL_COMMUNITY): Payer: BLUE CROSS/BLUE SHIELD

## 2018-12-10 ENCOUNTER — Encounter (HOSPITAL_COMMUNITY)
Admission: RE | Disposition: A | Discharge status: Home Health | Payer: Self-pay | Source: Ambulatory Visit | Attending: Orthopaedic Surgery

## 2018-12-10 ENCOUNTER — Inpatient Hospital Stay (HOSPITAL_COMMUNITY): Payer: BLUE CROSS/BLUE SHIELD | Admitting: Certified Registered Nurse Anesthetist

## 2018-12-10 ENCOUNTER — Observation Stay (HOSPITAL_COMMUNITY)
Admission: RE | Admit: 2018-12-10 | Discharge: 2018-12-11 | Disposition: A | Discharge status: Home Health | Payer: BLUE CROSS/BLUE SHIELD | Source: Ambulatory Visit | Attending: Orthopaedic Surgery | Admitting: Orthopaedic Surgery

## 2018-12-10 DIAGNOSIS — Z885 Allergy status to narcotic agent status: Secondary | ICD-10-CM | POA: Diagnosis not present

## 2018-12-10 DIAGNOSIS — Z471 Aftercare following joint replacement surgery: Secondary | ICD-10-CM | POA: Diagnosis not present

## 2018-12-10 DIAGNOSIS — M1611 Unilateral primary osteoarthritis, right hip: Principal | ICD-10-CM | POA: Insufficient documentation

## 2018-12-10 DIAGNOSIS — Z7982 Long term (current) use of aspirin: Secondary | ICD-10-CM | POA: Diagnosis not present

## 2018-12-10 DIAGNOSIS — Z96641 Presence of right artificial hip joint: Secondary | ICD-10-CM | POA: Diagnosis not present

## 2018-12-10 DIAGNOSIS — E039 Hypothyroidism, unspecified: Secondary | ICD-10-CM | POA: Diagnosis not present

## 2018-12-10 DIAGNOSIS — Z419 Encounter for procedure for purposes other than remedying health state, unspecified: Secondary | ICD-10-CM

## 2018-12-10 HISTORY — PX: TOTAL HIP ARTHROPLASTY: SHX124

## 2018-12-10 SURGERY — ARTHROPLASTY, HIP, TOTAL, ANTERIOR APPROACH
Anesthesia: Spinal | Laterality: Right

## 2018-12-10 MED ORDER — ACETAMINOPHEN 325 MG PO TABS
325.0000 mg | ORAL_TABLET | Freq: Four times a day (QID) | ORAL | Status: DC | PRN
  Administered 2018-12-11: 650 mg via ORAL
  Filled 2018-12-10: qty 2

## 2018-12-10 MED ORDER — OXYCODONE HCL 5 MG PO TABS: 10.0000 mg | ORAL_TABLET | ORAL | Status: DC | PRN

## 2018-12-10 MED ORDER — OXYCODONE HCL 5 MG PO TABS
5.0000 mg | ORAL_TABLET | ORAL | Status: DC | PRN
  Administered 2018-12-10: 5 mg via ORAL
  Filled 2018-12-10: qty 1

## 2018-12-10 MED ORDER — HYDROMORPHONE HCL 1 MG/ML IJ SOLN
0.2500 mg | INTRAMUSCULAR | Status: DC | PRN
  Administered 2018-12-10 (×5): 0.5 mg via INTRAVENOUS

## 2018-12-10 MED ORDER — HYDROMORPHONE HCL 1 MG/ML IJ SOLN
INTRAMUSCULAR | Status: AC
  Filled 2018-12-10: qty 1

## 2018-12-10 MED ORDER — CEFAZOLIN SODIUM-DEXTROSE 2-4 GM/100ML-% IV SOLN
2.0000 g | INTRAVENOUS | Status: AC
  Administered 2018-12-10: 2 g via INTRAVENOUS
  Filled 2018-12-10: qty 100

## 2018-12-10 MED ORDER — SODIUM CHLORIDE 0.9 % IR SOLN
Status: DC | PRN
  Administered 2018-12-10: 1000 mL

## 2018-12-10 MED ORDER — ONDANSETRON HCL 4 MG PO TABS: 4.0000 mg | ORAL_TABLET | Freq: Four times a day (QID) | ORAL | Status: DC | PRN

## 2018-12-10 MED ORDER — MIDAZOLAM HCL 2 MG/2ML IJ SOLN
INTRAMUSCULAR | Status: AC
  Filled 2018-12-10: qty 2

## 2018-12-10 MED ORDER — KETOROLAC TROMETHAMINE 15 MG/ML IJ SOLN
15.0000 mg | Freq: Four times a day (QID) | INTRAMUSCULAR | Status: AC
  Administered 2018-12-10 – 2018-12-11 (×4): 15 mg via INTRAVENOUS
  Filled 2018-12-10 (×3): qty 1

## 2018-12-10 MED ORDER — FENTANYL CITRATE (PF) 100 MCG/2ML IJ SOLN
INTRAMUSCULAR | Status: DC | PRN
  Administered 2018-12-10 (×2): 50 ug via INTRAVENOUS

## 2018-12-10 MED ORDER — POLYETHYLENE GLYCOL 3350 17 G PO PACK: 17.0000 g | PACK | Freq: Every day | ORAL | Status: DC | PRN

## 2018-12-10 MED ORDER — PROPOFOL 10 MG/ML IV BOLUS
INTRAVENOUS | Status: AC
  Filled 2018-12-10: qty 20

## 2018-12-10 MED ORDER — METHOCARBAMOL 500 MG IVPB - SIMPLE MED
500.0000 mg | Freq: Four times a day (QID) | INTRAVENOUS | Status: DC | PRN
  Administered 2018-12-10: 500 mg via INTRAVENOUS
  Filled 2018-12-10: qty 50

## 2018-12-10 MED ORDER — DOCUSATE SODIUM 100 MG PO CAPS
100.0000 mg | ORAL_CAPSULE | Freq: Two times a day (BID) | ORAL | Status: DC
  Administered 2018-12-10 – 2018-12-11 (×2): 100 mg via ORAL
  Filled 2018-12-10 (×2): qty 1

## 2018-12-10 MED ORDER — TRANEXAMIC ACID-NACL 1000-0.7 MG/100ML-% IV SOLN
1000.0000 mg | INTRAVENOUS | Status: AC
  Administered 2018-12-10: 1000 mg via INTRAVENOUS
  Filled 2018-12-10: qty 100

## 2018-12-10 MED ORDER — ALUM & MAG HYDROXIDE-SIMETH 200-200-20 MG/5ML PO SUSP: 30.0000 mL | ORAL | Status: DC | PRN

## 2018-12-10 MED ORDER — ONDANSETRON HCL 4 MG/2ML IJ SOLN
4.0000 mg | Freq: Four times a day (QID) | INTRAMUSCULAR | Status: DC | PRN
  Administered 2018-12-10: 4 mg via INTRAVENOUS
  Filled 2018-12-10: qty 2

## 2018-12-10 MED ORDER — MIDAZOLAM HCL 5 MG/5ML IJ SOLN
INTRAMUSCULAR | Status: DC | PRN
  Administered 2018-12-10 (×2): 1 mg via INTRAVENOUS

## 2018-12-10 MED ORDER — LACTATED RINGERS IV SOLN
INTRAVENOUS | Status: DC
  Administered 2018-12-10: 07:00:00 via INTRAVENOUS
  Administered 2018-12-10: 1000 mL via INTRAVENOUS
  Administered 2018-12-10: 10:00:00 via INTRAVENOUS

## 2018-12-10 MED ORDER — HYDROMORPHONE HCL 1 MG/ML IJ SOLN: 0.5000 mg | INTRAMUSCULAR | Status: DC | PRN

## 2018-12-10 MED ORDER — CHLORHEXIDINE GLUCONATE 4 % EX LIQD: 60.0000 mL | Freq: Once | CUTANEOUS | Status: DC

## 2018-12-10 MED ORDER — METOCLOPRAMIDE HCL 5 MG PO TABS: 5.0000 mg | ORAL_TABLET | Freq: Three times a day (TID) | ORAL | Status: DC | PRN

## 2018-12-10 MED ORDER — BUPIVACAINE IN DEXTROSE 0.75-8.25 % IT SOLN
INTRATHECAL | Status: DC | PRN
  Administered 2018-12-10: 2 mL via INTRATHECAL

## 2018-12-10 MED ORDER — ONDANSETRON HCL 4 MG/2ML IJ SOLN
INTRAMUSCULAR | Status: DC | PRN
  Administered 2018-12-10: 4 mg via INTRAVENOUS

## 2018-12-10 MED ORDER — DIPHENHYDRAMINE HCL 12.5 MG/5ML PO ELIX: 12.5000 mg | ORAL_SOLUTION | ORAL | Status: DC | PRN

## 2018-12-10 MED ORDER — PROPOFOL 10 MG/ML IV BOLUS
INTRAVENOUS | Status: AC
  Filled 2018-12-10: qty 60

## 2018-12-10 MED ORDER — METHOCARBAMOL 500 MG PO TABS: 500.0000 mg | ORAL_TABLET | Freq: Four times a day (QID) | ORAL | Status: DC | PRN

## 2018-12-10 MED ORDER — ONDANSETRON HCL 4 MG/2ML IJ SOLN: 4.0000 mg | Freq: Once | INTRAMUSCULAR | Status: DC | PRN

## 2018-12-10 MED ORDER — METHOCARBAMOL 500 MG IVPB - SIMPLE MED
INTRAVENOUS | Status: AC
  Filled 2018-12-10: qty 50

## 2018-12-10 MED ORDER — GABAPENTIN 100 MG PO CAPS
100.0000 mg | ORAL_CAPSULE | Freq: Three times a day (TID) | ORAL | Status: DC
  Administered 2018-12-10 – 2018-12-11 (×3): 100 mg via ORAL
  Filled 2018-12-10 (×3): qty 1

## 2018-12-10 MED ORDER — METOCLOPRAMIDE HCL 5 MG/ML IJ SOLN
5.0000 mg | Freq: Three times a day (TID) | INTRAMUSCULAR | Status: DC | PRN
  Administered 2018-12-10: 10 mg via INTRAVENOUS
  Filled 2018-12-10: qty 2

## 2018-12-10 MED ORDER — PHENOL 1.4 % MT LIQD: 1.0000 | OROMUCOSAL | Status: DC | PRN

## 2018-12-10 MED ORDER — FENTANYL CITRATE (PF) 100 MCG/2ML IJ SOLN
INTRAMUSCULAR | Status: AC
  Filled 2018-12-10: qty 2

## 2018-12-10 MED ORDER — CEFAZOLIN SODIUM-DEXTROSE 1-4 GM/50ML-% IV SOLN
1.0000 g | Freq: Four times a day (QID) | INTRAVENOUS | Status: AC
  Administered 2018-12-10 (×2): 1 g via INTRAVENOUS
  Filled 2018-12-10 (×2): qty 50

## 2018-12-10 MED ORDER — PROPOFOL 10 MG/ML IV BOLUS
INTRAVENOUS | Status: DC | PRN
  Administered 2018-12-10: 10 mg via INTRAVENOUS
  Administered 2018-12-10: 20 mg via INTRAVENOUS

## 2018-12-10 MED ORDER — PANTOPRAZOLE SODIUM 40 MG PO TBEC
40.0000 mg | DELAYED_RELEASE_TABLET | Freq: Every day | ORAL | Status: DC
  Administered 2018-12-10 – 2018-12-11 (×2): 40 mg via ORAL
  Filled 2018-12-10 (×2): qty 1

## 2018-12-10 MED ORDER — PROPOFOL 500 MG/50ML IV EMUL
INTRAVENOUS | Status: DC | PRN
  Administered 2018-12-10: 50 ug/kg/min via INTRAVENOUS

## 2018-12-10 MED ORDER — LEVOTHYROXINE SODIUM 100 MCG PO TABS
100.0000 ug | ORAL_TABLET | Freq: Every day | ORAL | Status: DC
  Administered 2018-12-11: 100 ug via ORAL
  Filled 2018-12-10: qty 1

## 2018-12-10 MED ORDER — KETOROLAC TROMETHAMINE 15 MG/ML IJ SOLN
INTRAMUSCULAR | Status: AC
  Administered 2018-12-10: 15 mg
  Filled 2018-12-10: qty 1

## 2018-12-10 MED ORDER — MENTHOL 3 MG MT LOZG: 1.0000 | LOZENGE | OROMUCOSAL | Status: DC | PRN

## 2018-12-10 MED ORDER — SODIUM CHLORIDE 0.9 % IV SOLN
INTRAVENOUS | Status: DC
  Administered 2018-12-10 – 2018-12-11 (×2): via INTRAVENOUS

## 2018-12-10 MED ORDER — MEPERIDINE HCL 50 MG/ML IJ SOLN: 6.2500 mg | INTRAMUSCULAR | Status: DC | PRN

## 2018-12-10 MED ORDER — ASPIRIN 81 MG PO CHEW
81.0000 mg | CHEWABLE_TABLET | Freq: Two times a day (BID) | ORAL | Status: DC
  Administered 2018-12-10 – 2018-12-11 (×2): 81 mg via ORAL
  Filled 2018-12-10 (×2): qty 1

## 2018-12-10 MED ORDER — ONDANSETRON HCL 4 MG/2ML IJ SOLN
INTRAMUSCULAR | Status: AC
  Filled 2018-12-10: qty 2

## 2018-12-10 SURGICAL SUPPLY — 41 items
APL SKNCLS STERI-STRIP NONHPOA (GAUZE/BANDAGES/DRESSINGS) ×1
BAG SPEC THK2 15X12 ZIP CLS (MISCELLANEOUS)
BAG ZIPLOCK 12X15 (MISCELLANEOUS) IMPLANT
BENZOIN TINCTURE PRP APPL 2/3 (GAUZE/BANDAGES/DRESSINGS) ×2 IMPLANT
BLADE SAW SGTL 18X1.27X75 (BLADE) IMPLANT
BLADE SAW SGTL 18X1.27X75MM (BLADE)
BLADE SURG SZ10 CARB STEEL (BLADE) ×6 IMPLANT
CHLORAPREP W/TINT 26ML (MISCELLANEOUS) ×2 IMPLANT
CLOSURE WOUND 1/2 X4 (GAUZE/BANDAGES/DRESSINGS) ×1
COVER PERINEAL POST (MISCELLANEOUS) ×3 IMPLANT
COVER SURGICAL LIGHT HANDLE (MISCELLANEOUS) ×3 IMPLANT
COVER WAND RF STERILE (DRAPES) IMPLANT
CUP SECTOR GRIPTON 50MM (Cup) ×2 IMPLANT
DRAPE STERI IOBAN 125X83 (DRAPES) ×3 IMPLANT
DRAPE U-SHAPE 47X51 STRL (DRAPES) ×6 IMPLANT
DRSG AQUACEL AG ADV 3.5X10 (GAUZE/BANDAGES/DRESSINGS) ×3 IMPLANT
ELECT REM PT RETURN 15FT ADLT (MISCELLANEOUS) ×3 IMPLANT
GAUZE XEROFORM 1X8 LF (GAUZE/BANDAGES/DRESSINGS) IMPLANT
GLOVE BIO SURGEON STRL SZ7.5 (GLOVE) ×3 IMPLANT
GLOVE BIOGEL PI IND STRL 8 (GLOVE) ×2 IMPLANT
GLOVE BIOGEL PI INDICATOR 8 (GLOVE) ×4
GLOVE ECLIPSE 8.0 STRL XLNG CF (GLOVE) ×3 IMPLANT
GOWN STRL REUS W/TWL XL LVL3 (GOWN DISPOSABLE) ×6 IMPLANT
HANDPIECE INTERPULSE COAX TIP (DISPOSABLE) ×3
HEAD FEMORAL 32 CERAMIC (Hips) ×2 IMPLANT
HOLDER FOLEY CATH W/STRAP (MISCELLANEOUS) ×3 IMPLANT
LINER ACETABULAR 32X50 (Liner) ×2 IMPLANT
PACK ANTERIOR HIP CUSTOM (KITS) ×3 IMPLANT
SCREW 6.5MMX25MM (Screw) ×2 IMPLANT
SET HNDPC FAN SPRY TIP SCT (DISPOSABLE) ×1 IMPLANT
STAPLER VISISTAT 35W (STAPLE) IMPLANT
STEM FEM ACTIS HIGH SZ3 (Stem) ×3 IMPLANT
STRIP CLOSURE SKIN 1/2X4 (GAUZE/BANDAGES/DRESSINGS) ×2 IMPLANT
SUT ETHIBOND NAB CT1 #1 30IN (SUTURE) ×3 IMPLANT
SUT MNCRL AB 4-0 PS2 18 (SUTURE) IMPLANT
SUT VIC AB 0 CT1 36 (SUTURE) ×3 IMPLANT
SUT VIC AB 1 CT1 36 (SUTURE) ×3 IMPLANT
SUT VIC AB 2-0 CT1 27 (SUTURE) ×6
SUT VIC AB 2-0 CT1 TAPERPNT 27 (SUTURE) ×2 IMPLANT
TRAY FOLEY MTR SLVR 16FR STAT (SET/KITS/TRAYS/PACK) ×3 IMPLANT
YANKAUER SUCT BULB TIP 10FT TU (MISCELLANEOUS) ×3 IMPLANT

## 2018-12-10 NOTE — Evaluation (Signed)
Physical Therapy Evaluation Patient Details Name: Jacqueline Stout MRN: [id number] DOB: 1971-05-26 Today's Date: 12/10/2018   History of Present Illness  s/p R DA THA  Clinical Impression  Pt is s/p THA resulting in the deficits listed below (see PT Problem List). Pt cooperative and motivated however mobility limited d/t nausea despite meds; anticipate pt will progress well once nausea resolves;  has flight of stairs up to bedroom and would like to be able to go upstairs at d/c; will continue to follow in acute setting; Plan is for HHPT per MD/H&P  Pt will benefit from skilled PT to increase their independence and safety with mobility to allow discharge to the venue listed below.      Follow Up Recommendations Follow surgeon's recommendation for DC plan and follow-up therapies;Home health PT    Equipment Recommendations  Rolling walker with 5" wheels    Recommendations for Other Services       Precautions / Restrictions Precautions Precautions: Fall Restrictions Weight Bearing Restrictions: No Other Position/Activity Restrictions: WBAT      Mobility  Bed Mobility Overal bed mobility: Needs Assistance Bed Mobility: Supine to Sit     Supine to sit: Min assist     General bed mobility comments: cues for technique, assist with RLE  Transfers Overall transfer level: Needs assistance Equipment used: Rolling walker (2 wheeled) Transfers: Sit to/from [employer] Sit to Stand: Min assist         General transfer comment: cues for hand placement and RLE position; pivotal steps with RW and min assist, distance limited d/t nausea  Ambulation/Gait                Stairs            Wheelchair Mobility    Modified Rankin (Stroke Patients Only)       Balance                                             Pertinent Vitals/Pain Pain Assessment: 0-10 Pain Score: 4  Pain Location: right hip Pain Descriptors / Indicators:  Sore Pain Intervention(s): Monitored during session;Limited activity within patient's tolerance;Premedicated before session    Home Living Family/patient expects to be discharged to:: Private residence Living Arrangements: Spouse/significant other Available Help at Discharge: Family;Available 24 hours/day Type of Home: House Home Access: Stairs to enter Entrance Stairs-Rails: Right Entrance Stairs-Number of Steps: 3 Home Layout: Two level;Bed/bath upstairs;1/2 bath on main level Home Equipment: Crutches      Prior Function Level of Independence: Independent               Hand Dominance        Extremity/Trunk Assessment   Upper Extremity Assessment Upper Extremity Assessment: Defer to OT evaluation    Lower Extremity Assessment Lower Extremity Assessment: RLE deficits/detail RLE Deficits / Details: ankle WFL, knee and hip grossly 3/5       Communication   Communication: No difficulties  Cognition Arousal/Alertness: Awake/alert Behavior During Therapy: WFL for tasks assessed/performed Overall Cognitive Status: Within Functional Limits for tasks assessed                                        General Comments      Exercises Total Joint Exercises Ankle Circles/Pumps: AROM;Both;10  reps   Assessment/Plan    PT Assessment Patient needs continued PT services  PT Problem List Decreased strength;Decreased range of motion;Decreased activity tolerance;Decreased mobility;Decreased knowledge of use of DME       PT Treatment Interventions DME instruction;Functional mobility training;Therapeutic activities;Stair training;Gait training;Patient/family education    PT Goals (Current goals can be found in the Care Plan section)  Acute Rehab PT Goals Patient Stated Goal: less hip pain; would like to be able to go upstairs to bedroom PT Goal Formulation: With patient Time For Goal Achievement: 12/17/18 Potential to Achieve Goals: Good    Frequency  7X/week   Barriers to discharge        Co-evaluation               AM-PAC PT "6 Clicks" Mobility  Outcome Measure Help needed turning from your back to your side while in a flat bed without using bedrails?: A Little Help needed moving from lying on your back to sitting on the side of a flat bed without using bedrails?: A Little Help needed moving to and from a bed to a chair (including a wheelchair)?: A Little Help needed standing up from a chair using your arms (e.g., wheelchair or bedside chair)?: A Little Help needed to walk in hospital room?: A Lot Help needed climbing 3-5 steps with a railing? : A Lot 6 Click Score: 16    End of Session Equipment Utilized During Treatment: Gait belt Activity Tolerance: Patient tolerated treatment well Patient left: in chair;with family/visitor present;with call bell/phone within reach;with chair alarm set   PT Visit Diagnosis: Difficulty in walking, not elsewhere classified (R26.2)    Time: 3500-9381 PT Time Calculation (min) (ACUTE ONLY): 19 min   Charges:   PT Evaluation $PT Eval Low Complexity: 1 Low          Drucilla Chalet, PT  Pager: [phone] Acute Rehab Dept [facility]): [phone]   12/10/2018   [facility] 12/10/2018, 6:09 PM

## 2018-12-10 NOTE — Anesthesia Postprocedure Evaluation (Signed)
Anesthesia Post Note  Patient: Jacqueline Stout  Procedure(s) Performed: RIGHT TOTAL HIP ARTHROPLASTY ANTERIOR APPROACH (Right )     Patient location during evaluation: PACU Anesthesia Type: Spinal Level of consciousness: oriented and awake and alert Pain management: pain level controlled Vital Signs Assessment: post-procedure vital signs reviewed and stable Respiratory status: spontaneous breathing, respiratory function stable and patient connected to nasal cannula oxygen Cardiovascular status: blood pressure returned to baseline and stable Postop Assessment: no headache, no backache and no apparent nausea or vomiting Anesthetic complications: no    Last Vitals:  Vitals:   12/10/18 1549 12/10/18 1642  BP: 115/76 118/71  Pulse: (!) 57 62  Resp: 14 16  Temp: (!) 36.4 C 36.6 C  SpO2: 100% 100%    Last Pain:  Vitals:   12/10/18 1658  TempSrc:   PainSc: Asleep                 Marisah Laker DAVID

## 2018-12-10 NOTE — Brief Op Note (Signed)
12/10/2018  10:16 AM  PATIENT:  Jacqueline Stout  48 y.o. female  PRE-OPERATIVE DIAGNOSIS:  osteoarthritis right hip  POST-OPERATIVE DIAGNOSIS:  osteoarthritis right hip  PROCEDURE:  Procedure(s): RIGHT TOTAL HIP ARTHROPLASTY ANTERIOR APPROACH (Right)  SURGEON:  Surgeon(s) and Role:    Kathryne Hitch, MD - Primary  PHYSICIAN ASSISTANT: Rexene Edison, PA-C  ANESTHESIA:   spinal  EBL:  300 mL   COUNTS:  YES  DICTATION: .Other Dictation: Dictation Number [id number]  PLAN OF CARE: Admit to inpatient   PATIENT DISPOSITION:  PACU - hemodynamically stable.   Delay start of Pharmacological VTE agent (>24hrs) due to surgical blood loss or risk of bleeding: no

## 2018-12-10 NOTE — Anesthesia Preprocedure Evaluation (Signed)
Anesthesia Evaluation  Patient identified by MRN, date of birth, ID band Patient awake    Reviewed: Allergy & Precautions, NPO status , Patient's Chart, lab work & pertinent test results  Airway Mallampati: I  TM Distance: >3 FB Neck ROM: Full    Dental   Pulmonary    Pulmonary exam normal        Cardiovascular Normal cardiovascular exam     Neuro/Psych    GI/Hepatic   Endo/Other    Renal/GU      Musculoskeletal   Abdominal   Peds  Hematology   Anesthesia Other Findings   Reproductive/Obstetrics                             Anesthesia Physical Anesthesia Plan  ASA: II  Anesthesia Plan: Spinal   Post-op Pain Management:    Induction: Intravenous  PONV Risk Score and Plan: 2 and Ondansetron and Midazolam  Airway Management Planned: Simple Face Mask  Additional Equipment:   Intra-op Plan:   Post-operative Plan:   Informed Consent: I have reviewed the patients History and Physical, chart, labs and discussed the procedure including the risks, benefits and alternatives for the proposed anesthesia with the patient or authorized representative who has indicated his/her understanding and acceptance.       Plan Discussed with: CRNA and Surgeon  Anesthesia Plan Comments:         Anesthesia Quick Evaluation

## 2018-12-10 NOTE — Anesthesia Procedure Notes (Signed)
Procedure Name: MAC Date/Time: 12/10/2018 8:42 AM Performed by: Maxwell Caul, CRNA Pre-anesthesia Checklist: Patient identified, Emergency Drugs available, Suction available and Patient being monitored Oxygen Delivery Method: Simple face mask

## 2018-12-10 NOTE — Transfer of Care (Signed)
Immediate Anesthesia Transfer of Care Note  Patient: Jacqueline Stout  Procedure(s) Performed: RIGHT TOTAL HIP ARTHROPLASTY ANTERIOR APPROACH (Right )  Patient Location: PACU  Anesthesia Type:Spinal  Level of Consciousness: awake, alert  and oriented  Airway & Oxygen Therapy: Patient Spontanous Breathing and Patient connected to face mask oxygen  Post-op Assessment: Report given to RN and Post -op Vital signs reviewed and stable  Post vital signs: Reviewed and stable  Last Vitals:  Vitals Value Taken Time  BP    Temp    Pulse 84 12/10/2018 10:35 AM  Resp 14 12/10/2018 10:35 AM  SpO2 100 % 12/10/2018 10:35 AM  Vitals shown include unvalidated device data.  Last Pain:  Vitals:   12/10/18 0640  TempSrc:   PainSc: 2       Patients Stated Pain Goal: 3 (12/10/18 0640)  Complications: No apparent anesthesia complications

## 2018-12-10 NOTE — Op Note (Signed)
NAME: Jacqueline Stout, KNECHT MEDICAL RECORD MH:[id number] ACCOUNT [id number] DATE OF BIRTH:12-15-1970 FACILITY: [facility] LOCATION: [facility] PHYSICIAN:Tanara Turvey Aretha Parrot, MD  OPERATIVE REPORT  DATE OF PROCEDURE:  12/10/2018  PREOPERATIVE DIAGNOSIS:  Primary osteoarthritis and degenerative joint disease, right hip.  POSTOPERATIVE DIAGNOSIS:  Primary osteoarthritis and degenerative joint disease, right hip.  PROCEDURE:  Right total hip arthroplasty through direct anterior approach.  IMPLANTS:  DePuy Sector Gription acetabular component size 50 with a single screw, size 32+0 neutral polyethylene liner, size 3 high offset Actis femoral component, size 32+1 ceramic hip ball.  SURGEON:  Vanita Panda. Magnus Ivan, MD  ASSISTANT:  Richardean Canal, PA-C  ANESTHESIA:  Spinal.  ANTIBIOTICS:  Two grams IV Ancef.  ESTIMATED BLOOD LOSS:  Under 300 mL.  COMPLICATIONS:  None.  INDICATIONS:  The patient is only a 48 year old very pleasant female with debilitating arthritis involving her right hip.  This has been going on for some amount of time.  Although her x-rays show just minimal joint space narrowing, it is definitely  different than her other side, but her MRI showed more extensive wear of the femoral head with a large geode in the acetabular side of things.  She had significant synovitis of her hip with degenerative labral tearing and certainly a wider area of  cartilage loss.  She has tried and failed all forms of conservative treatment including intraarticular steroid injection.  At this point, her pain is daily and it has detrimentally affected her mobility, her quality of life, and her activities of daily  living to the point that she does wish to proceed with a total hip arthroplasty.  We had a long and thorough discussion about this.  We talked about the risk of acute blood loss anemia, nerve and vessel injury, fracture, infection, dislocation, DVT and  implant failure.  We talked about the  goals of being decreased pain, improved mobility and overall improved quality of life.  DESCRIPTION OF PROCEDURE:  After informed consent was obtained and appropriate right hip was marked, she was brought to the operating room and sat up on a stretcher where spinal anesthesia was then obtained.  She was laid in the supine position on a  stretcher.  A Foley catheter was placed, and both feet had traction boots applied to them.  Next, she was placed supine on the Hana fracture table, the perineal post in place, and both legs in in-line skeletal traction device and no traction applied.   Her right operative hip was prepped and draped with DuraPrep and sterile drapes.  A time-out was called, and she was identified as correct patient, correct right hip.  I then made an incision just inferior and posterior to the anterior superior iliac  spine and carried this obliquely down the leg.  I dissected down the tensor fascia lata muscle.  Tensor fascia was then divided longitudinally to proceed with direct anterior approach to the hip.  We identified and cauterized circumflex vessels.  I then  identified the hip capsule, entered the hip capsule in an L-type format, finding a moderate joint effusion and some periarticular osteophytes around the femoral head and neck.  We placed Cobra retractors around the medial and lateral femoral neck and  made our femoral neck cut with an oscillating saw and completed this with an osteotome.  We placed a corkscrew guide in the femoral head and removed the femoral head in its entirety and found a wide area devoid of cartilage.  She did have a moderate  joint effusion  as well.  We then placed a bent Hohmann over the medial acetabular rim and removed remnants of the acetabular labrum and other debris from the hip.  We then began reaming under direct visualization from a size 44 reamer in stepwise  increments up to a size 49 with all reamers under direct visualization, the last reamer  under direct fluoroscopy, so we could obtain our depth of reaming, our inclination and anteversion.  I then placed the real DePuy Sector Gription acetabular component  size 50, and I placed a single screw.  I appreciated the fit of this under direct fluoroscopy and the feel of it.  Attention was then turned to the femur.  With the leg externally rotated to 120 degrees, extended and adducted, we were able to place a  Mueller retractor medially and a Hohmann retractor behind the greater trochanter, released the lateral joint capsule and used a box-cutting osteotome to enter the femoral canal and a rongeur to lateralize.  We then began broaching from a size 0 broach,  going up to a size 3 in stepwise increments.  With a size 3 in place, we trialed a standard offset femoral neck and a 32+1 hip ball, reduced this in the acetabulum, and I was pleased with the leg lengths, but I felt like she needed more offset.  We then  removed the trial components.  We went with the real high offset femoral component, size 3, which is the Actis system.  We went with a 32+1 ceramic hip ball, reduced this in the acetabulum, and I was pleased with leg length, offset, range of motion and  stability on exam and fluoroscopy.  We then irrigated the soft tissue with normal saline solution using pulsatile lavage.  We closed the joint capsule with interrupted #1 Ethibond suture, followed by running fascia in the tensor fascia, 0 Vicryl in the  deep tissue, 2-0 Vicryl subcutaneous tissue, 4-0 Monocryl subcuticular stitch and Steri-Strips on the skin.  An Aquacel dressing was applied.  She was taken off the Hana table and taken to recovery room in stable condition.  All final counts were  correct.  There were no complications noted.  Of note, Rexene Edison, PA-C, assisted the entire case.  Assistance was crucial for facilitating all aspects of this case.  LN/NUANCE  D:12/10/2018 T:12/10/2018 JOB:[id number]

## 2018-12-10 NOTE — Anesthesia Procedure Notes (Signed)
Spinal  Patient location during procedure: OR Start time: 12/10/2018 8:45 AM End time: 12/10/2018 8:48 AM Staffing Anesthesiologist: Arta Bruce, MD Performed: anesthesiologist  Preanesthetic Checklist Completed: patient identified, surgical consent, pre-op evaluation, timeout performed, IV checked, risks and benefits discussed and monitors and equipment checked Spinal Block Patient position: sitting Prep: DuraPrep Patient monitoring: blood pressure, continuous pulse ox, cardiac monitor and heart rate Location: L3-4 Injection technique: single-shot Needle Needle type: Pencan  Needle gauge: 22 G Needle length: 9 cm Needle insertion depth: 7 cm

## 2018-12-10 NOTE — Plan of Care (Signed)
Plan of care 

## 2018-12-10 NOTE — H&P (Signed)
TOTAL HIP ADMISSION H&P  Patient is admitted for right total hip arthroplasty.  Subjective:  Chief Complaint: right hip pain  HPI: Jacqueline Stout, 48 y.o. female, has a history of pain and functional disability in the right hip(s) due to arthritis and patient has failed non-surgical conservative treatments for greater than 12 weeks to include NSAID's and/or analgesics, corticosteriod injections, flexibility and strengthening excercises, use of assistive devices and activity modification.  Onset of symptoms was gradual starting 2 years ago with gradually worsening course since that time.The patient noted no past surgery on the right hip(s).  Patient currently rates pain in the right hip at 10 out of 10 with activity. Patient has night pain, worsening of pain with activity and weight bearing, trendelenberg gait, pain that interfers with activities of daily living and pain with passive range of motion. Patient has evidence of subchondral sclerosis, periarticular osteophytes and joint space narrowing by imaging studies. This condition presents safety issues increasing the risk of falls.  There is no current active infection.  Patient Active Problem List   Diagnosis Date Noted  . Unilateral primary osteoarthritis, right hip 11/10/2018  . Stress fracture of right hip 08/26/2018  . Stress fracture of hip 07/01/2018  . Hypothyroidism 06/29/2018  . Right hip pain 06/28/2018   Past Medical History:  Diagnosis Date  . Arthritis     Past Surgical History:  Procedure Laterality Date  . DILATION AND CURETTAGE OF UTERUS    . WRIST SURGERY     cyst on left wrist    Current Facility-Administered Medications  Medication Dose Route Frequency Provider Last Rate Last Dose  . ceFAZolin (ANCEF) IVPB 2g/100 mL premix  2 g Intravenous On Call to OR Kirtland Bouchard, PA-C      . chlorhexidine (HIBICLENS) 4 % liquid 4 application  60 mL Topical Once Kirtland Bouchard, PA-C      . lactated ringers infusion    Intravenous Continuous Arta Bruce, MD 50 mL/hr at 12/10/18 [phone]    . tranexamic acid (CYKLOKAPRON) IVPB 1,000 mg  1,000 mg Intravenous To OR Kirtland Bouchard, PA-C       Allergies  Allergen Reactions  . Vicodin [Hydrocodone-Acetaminophen] Nausea And Vomiting    Social History   Tobacco Use  . Smoking status: Never Smoker  . Smokeless tobacco: Never Used  Substance Use Topics  . Alcohol use: Yes    Comment: occasionally    History reviewed. No pertinent family history.   Review of Systems  Musculoskeletal: Positive for joint pain.  All other systems reviewed and are negative.   Objective:  Physical Exam  Constitutional: She is oriented to person, place, and time. She appears well-developed and well-nourished.  HENT:  Head: Normocephalic.  Eyes: Pupils are equal, round, and reactive to light.  Neck: Normal range of motion.  Cardiovascular: Normal rate.  Respiratory: Effort normal.  GI: Soft.  Musculoskeletal:     Right hip: She exhibits decreased range of motion, decreased strength, tenderness and bony tenderness.  Neurological: She is alert and oriented to person, place, and time.  Skin: Skin is warm and dry.  Psychiatric: She has a normal mood and affect.    Vital signs in last 24 hours: Temp:  [98.4 F (36.9 C)] 98.4 F (36.9 C) (02/28 0537) Pulse Rate:  [80] 80 (02/28 0537) Resp:  [18] 18 (02/28 0537) BP: (137)/(78) 137/78 (02/28 0537) SpO2:  [97 %] 97 % (02/28 0537) Weight:  [61 kg] 61 kg (02/28 0640)  Labs:   Estimated body mass index is 23.07 kg/m as calculated from the following:   Height as of this encounter: 5\' 4"  (1.626 m).   Weight as of this encounter: 61 kg.   Imaging Review Plain radiographs demonstrate severe degenerative joint disease of the right hip(s). The bone quality appears to be excellent for age and reported activity level.      Assessment/Plan:  End stage arthritis, right hip(s)  The patient history, physical  examination, clinical judgement of the provider and imaging studies are consistent with end stage degenerative joint disease of the right hip(s) and total hip arthroplasty is deemed medically necessary. The treatment options including medical management, injection therapy, arthroscopy and arthroplasty were discussed at length. The risks and benefits of total hip arthroplasty were presented and reviewed. The risks due to aseptic loosening, infection, stiffness, dislocation/subluxation,  thromboembolic complications and other imponderables were discussed.  The patient acknowledged the explanation, agreed to proceed with the plan and consent was signed. Patient is being admitted for inpatient treatment for surgery, pain control, PT, OT, prophylactic antibiotics, VTE prophylaxis, progressive ambulation and ADL's and discharge planning.The patient is planning to be discharged home with home health services    Patient's anticipated LOS is less than 2 midnights, meeting these requirements: - Younger than 58 - Lives within 1 hour of care - Has a competent adult at home to recover with post-op recover - NO history of  - Chronic pain requiring opiods  - Diabetes  - Coronary Artery Disease  - Heart failure  - Heart attack  - Stroke  - DVT/VTE  - Cardiac arrhythmia  - Respiratory Failure/COPD  - Renal failure  - Anemia  - Advanced Liver disease

## 2018-12-11 DIAGNOSIS — E039 Hypothyroidism, unspecified: Secondary | ICD-10-CM | POA: Diagnosis not present

## 2018-12-11 DIAGNOSIS — Z885 Allergy status to narcotic agent status: Secondary | ICD-10-CM | POA: Diagnosis not present

## 2018-12-11 DIAGNOSIS — Z7982 Long term (current) use of aspirin: Secondary | ICD-10-CM | POA: Diagnosis not present

## 2018-12-11 DIAGNOSIS — M1611 Unilateral primary osteoarthritis, right hip: Secondary | ICD-10-CM | POA: Diagnosis not present

## 2018-12-11 LAB — BASIC METABOLIC PANEL
Anion gap: 5 (ref 5–15)
BUN: 8 mg/dL (ref 6–20)
CO2: 25 mmol/L (ref 22–32)
Calcium: 8.6 mg/dL — ABNORMAL LOW (ref 8.9–10.3)
Chloride: 108 mmol/L (ref 98–111)
Creatinine, Ser: 0.55 mg/dL (ref 0.44–1.00)
GFR calc Af Amer: >60 mL/min (ref >60)
GFR calc non Af Amer: >60 mL/min (ref >60)
Glucose, Bld: 111 mg/dL — ABNORMAL HIGH (ref 70–99)
Potassium: 3.9 mmol/L (ref 3.5–5.1)
Sodium: 138 mmol/L (ref 135–145)

## 2018-12-11 LAB — CBC
HEMATOCRIT: 32.5 % — AB (ref 36.0–46.0)
Hemoglobin: 10.7 g/dL — ABNORMAL LOW (ref 12.0–15.0)
MCH: 31.3 pg (ref 26.0–34.0)
MCHC: 32.9 g/dL (ref 30.0–36.0)
MCV: 95 fL (ref 80.0–100.0)
Platelets: 173 10*3/uL (ref 150–400)
RBC: 3.42 MIL/uL — ABNORMAL LOW (ref 3.87–5.11)
RDW: 11.9 % (ref 11.5–15.5)
WBC: 9.8 10*3/uL (ref 4.0–10.5)
nRBC: 0 % (ref 0.0–0.2)

## 2018-12-11 MED ORDER — ASPIRIN 81 MG PO CHEW: 81.0000 mg | CHEWABLE_TABLET | Freq: Two times a day (BID) | ORAL | 0 refills | Status: DC

## 2018-12-11 NOTE — Discharge Instructions (Signed)

## 2018-12-11 NOTE — Care Management Note (Signed)
Case Management Note  Patient Details  Name: Jacqueline Stout MRN: [id number] Date of Birth: 10-26-1970  Subjective/Objective:      Admitted s/p R DA THA              Action/Plan: Spoke to pt and offered choice for HH/medicare list provided and placed on chart. Pt agreeable to [facility] for HH. Contacted [facility] for RW and 3n1 bedside commode for home. Husband will assist with care.   Expected Discharge Date:  12/11/18               Expected Discharge Plan:  Home w Home Health Services  In-House Referral:  NA  Discharge planning Services  CM Consult  Post Acute Care Choice:  Home Health Choice offered to:  Patient  DME Arranged:  3-N-1, Walker rolling DME Agency:  [facility]  HH Arranged:  PT HH Agency:  [facility]  Status of Service:  Completed, signed off  If discussed at [employer], dates discussed:    Additional Comments:  Elliot Cousin, RN 12/11/2018, 12:42 PM

## 2018-12-11 NOTE — Evaluation (Signed)
Occupational Therapy Evaluation Patient Details Name: Jacqueline Stout MRN: [id number] DOB: 04-Oct-1971 Today's Date: 12/11/2018    History of Present Illness s/p R DA THA   Clinical Impression   Pt admitted with above diagnoses, mild pain limiting ability to complete BADL at desired level of ind. Educated and introduced on LB ADL equipment, pt deferring to have spouse assist with dressing. Pt is completing functional mobility at supervision-min guard level, anticipate assist only needed for short time. Assessed need for 3:1 use for shower chair, pt in agreement and requesting. No further OT needs identified, OT will sign off for one time eval. Please reconsult if changes arise.    Follow Up Recommendations  No OT follow up    Equipment Recommendations  3 in 1 bedside commode;Other (comment)(long handled sponge)    Recommendations for Other Services       Precautions / Restrictions Precautions Precautions: Fall Restrictions Weight Bearing Restrictions: No Other Position/Activity Restrictions: WBAT      Mobility Bed Mobility Overal bed mobility: Needs Assistance Bed Mobility: Supine to Sit;Sit to Supine     Supine to sit: Min guard Sit to supine: Min guard   General bed mobility comments: up in chair  Transfers Overall transfer level: Needs assistance Equipment used: Rolling walker (2 wheeled) Transfers: Sit to/from Stand Sit to Stand: Supervision         General transfer comment: hand placement cues    Balance Overall balance assessment: Mild deficits observed, not formally tested                                         ADL either performed or assessed with clinical judgement   ADL Overall ADL's : Needs assistance/impaired Eating/Feeding: Independent   Grooming: Supervision/safety;Standing   Upper Body Bathing: Modified independent;Sitting Upper Body Bathing Details (indicate cue type and reason): 3:1 as shower seat Lower Body  Bathing: Min guard;Sit to/from stand;Sitting/lateral leans;With adaptive equipment   Upper Body Dressing : Independent   Lower Body Dressing: Minimal assistance;Sitting/lateral leans;Sit to/from stand;With caregiver independent assisting;With adaptive equipment Lower Body Dressing Details (indicate cue type and reason): defer AE will have spouse assist Toilet Transfer: Min guard;Regular Toilet;RW   Toileting- Clothing Manipulation and Hygiene: Independent   Tub/ Shower Transfer: Min guard;3 in 1;Shower seat;Rolling walker   Functional mobility during ADLs: Supervision/safety;Min guard;Rolling walker General ADL Comments: educated pt on LB ADL equipment, pt deferring for spouse to use. Assessed need for 3:1 in shower, pt agreeable. Willing to acquire long handle sponge     Vision Baseline Vision/History: No visual deficits Patient Visual Report: No change from baseline       Perception     Praxis      Pertinent Vitals/Pain Pain Assessment: 0-10 Pain Score: 1  Pain Location: right hip Pain Descriptors / Indicators: Sore;Tightness Pain Intervention(s): Monitored during session;Premedicated before session     Hand Dominance     Extremity/Trunk Assessment Upper Extremity Assessment Upper Extremity Assessment: Overall WFL for tasks assessed   Lower Extremity Assessment Lower Extremity Assessment: Defer to PT evaluation       Communication Communication Communication: No difficulties   Cognition Arousal/Alertness: Awake/alert Behavior During Therapy: WFL for tasks assessed/performed Overall Cognitive Status: Within Functional Limits for tasks assessed  General Comments       Exercises Total Joint Exercises Ankle Circles/Pumps: AROM;Both;10 reps Quad Sets: AROM;Both;10 reps Short Arc Quad: AROM;Right;10 reps;Supine Heel Slides: AROM Hip ABduction/ADduction: AAROM;Right;10 reps;Supine Long Arc Quad: AROM;Right;10  reps;Seated Marching in Standing: AAROM;Seated;Right;10 reps   Shoulder Instructions      Home Living Family/patient expects to be discharged to:: Private residence Living Arrangements: Spouse/significant other Available Help at Discharge: Family;Available 24 hours/day Type of Home: House Home Access: Stairs to enter [employer] of Steps: 3 Entrance Stairs-Rails: Right Home Layout: Two level;Bed/bath upstairs;1/2 bath on main level Alternate Level Stairs-Number of Steps: flight       Bathroom Toilet: Standard     Home Equipment: Crutches          Prior Functioning/Environment Level of Independence: Independent                 OT Problem List: Pain;Decreased knowledge of precautions;Impaired balance (sitting and/or standing);Decreased knowledge of use of DME or AE;Decreased strength      OT Treatment/Interventions:      OT Goals(Current goals can be found in the care plan section) Acute Rehab OT Goals Patient Stated Goal: regain ind OT Goal Formulation: With patient/family Time For Goal Achievement: 12/25/18 Potential to Achieve Goals: Good  OT Frequency:     Barriers to D/C:            Co-evaluation              AM-PAC OT "6 Clicks" Daily Activity     Outcome Measure Help from another person eating meals?: None Help from another person taking care of personal grooming?: None Help from another person toileting, which includes using toliet, bedpan, or urinal?: A Little Help from another person bathing (including washing, rinsing, drying)?: A Little Help from another person to put on and taking off regular upper body clothing?: None Help from another person to put on and taking off regular lower body clothing?: A Little 6 Click Score: 21   End of Session    Activity Tolerance: Patient tolerated treatment well Patient left: in chair;with call bell/phone within reach;with chair alarm set;with family/visitor present  OT Visit Diagnosis:  Other abnormalities of gait and mobility (R26.89);Pain Pain - Right/Left: Right Pain - part of body: Hip                Time: 7616-0737 OT Time Calculation (min): 10 min Charges:  OT General Charges $OT Visit: 1 Visit OT Evaluation $OT Eval Low Complexity: 1 Low  Dalphine Handing, MSOT, OTR/L Behavioral Health OT/ Acute Relief OT [facility] Office: [phone]  Dalphine Handing 12/11/2018, 1:07 PM

## 2018-12-11 NOTE — Progress Notes (Signed)
Subjective: 1 Day Post-Op Procedure(s) (LRB): RIGHT TOTAL HIP ARTHROPLASTY ANTERIOR APPROACH (Right) Patient reports pain as mild.    Objective: Vital signs in last 24 hours: Temp:  [97.4 F (36.3 C)-98.3 F (36.8 C)] 98.3 F (36.8 C) (02/29 0936) Pulse Rate:  [57-90] 90 (02/29 0936) Resp:  [14-16] 16 (02/29 0936) BP: (107-122)/(71-81) 113/71 (02/29 0936) SpO2:  [100 %] 100 % (02/29 0936) Weight:  [60 kg] 60 kg (02/28 1343)  Intake/Output from previous day: 02/28 0701 - 02/29 0700 In: 3612.9 [P.O.:670; I.V.:2642.9; IV Piggyback:300] Out: 2000 [Urine:1700; Blood:300] Intake/Output this shift: Total I/O In: 240 [P.O.:240] Out: -   Recent Labs    12/11/18 0501  HGB 10.7*   Recent Labs    12/11/18 0501  WBC 9.8  RBC 3.42*  HCT 32.5*  PLT 173   Recent Labs    12/11/18 0501  NA 138  K 3.9  CL 108  CO2 25  BUN 8  CREATININE 0.55  GLUCOSE 111*  CALCIUM 8.6*   No results for input(s): LABPT, INR in the last 72 hours.  Sensation intact distally Intact pulses distally Dorsiflexion/Plantar flexion intact Incision: dressing C/D/I No cellulitis present Compartment soft   Assessment/Plan: 1 Day Post-Op Procedure(s) (LRB): RIGHT TOTAL HIP ARTHROPLASTY ANTERIOR APPROACH (Right) Up with therapy Discharge home with home health this afternoon.    Patient's anticipated LOS is less than 2 midnights, meeting these requirements: - Younger than 31 - Lives within 1 hour of care - Has a competent adult at home to recover with post-op recover - NO history of  - Chronic pain requiring opiods  - Diabetes  - Coronary Artery Disease  - Heart failure  - Heart attack  - Stroke  - DVT/VTE  - Cardiac arrhythmia  - Respiratory Failure/COPD  - Renal failure  - Anemia  - Advanced Liver disease       Kathryne Hitch 12/11/2018, 12:25 PM

## 2018-12-11 NOTE — Progress Notes (Signed)
Physical Therapy Treatment Patient Details Name: Jacqueline Stout MRN: [id number] DOB: Oct 03, 1971 Today's Date: 12/11/2018    History of Present Illness s/p R DA THA    PT Comments    Progressing well. Ready for Dc.   Follow Up Recommendations  Follow surgeon's recommendation for DC plan and follow-up therapies;Home health PT     Equipment Recommendations  Rolling walker with 5" wheels    Recommendations for Other Services       Precautions / Restrictions Precautions Precautions: Fall Restrictions Other Position/Activity Restrictions: WBAT    Mobility  Bed Mobility Overal bed mobility: Needs Assistance Bed Mobility: Supine to Sit;Sit to Supine     Supine to sit: Min guard Sit to supine: Min guard   General bed mobility comments: oob  Transfers Overall transfer level: Needs assistance Equipment used: Rolling walker (2 wheeled) Transfers: Sit to/from Stand Sit to Stand: Supervision         General transfer comment: cues for hand placement and RLE position;   Ambulation/Gait Ambulation/Gait assistance: Supervision Gait Distance (Feet): 300 Feet Assistive device: Rolling walker (2 wheeled) Gait Pattern/deviations: Step-through pattern     General Gait Details: smoothe reciprocal gait.   Stairs Stairs: Yes Stairs assistance: Supervision Stair Management: One rail Right;Forwards;With crutches Number of Stairs: 4 General stair comments: patient on Crutches PTA, safe on steps   Wheelchair Mobility    Modified Rankin (Stroke Patients Only)       Balance                                            Cognition Arousal/Alertness: Awake/alert                                            Exercises     General Comments        Pertinent Vitals/Pain Pain Score: 1  Pain Location: right hip Pain Descriptors / Indicators: Sore;Tightness Pain Intervention(s): Monitored during session;Premedicated before session     Home Living                      Prior Function            PT Goals (current goals can now be found in the care plan section) Progress towards PT goals: Progressing toward goals    Frequency    7X/week      PT Plan Current plan remains appropriate    Co-evaluation              AM-PAC PT "6 Clicks" Mobility   Outcome Measure  Help needed turning from your back to your side while in a flat bed without using bedrails?: A Little Help needed moving from lying on your back to sitting on the side of a flat bed without using bedrails?: A Little Help needed moving to and from a bed to a chair (including a wheelchair)?: A Little Help needed standing up from a chair using your arms (e.g., wheelchair or bedside chair)?: A Little Help needed to walk in hospital room?: A Little Help needed climbing 3-5 steps with a railing? : A Little 6 Click Score: 18    End of Session   Activity Tolerance: Patient tolerated treatment well Patient left: (up in room with spouse)  Nurse Communication: Mobility status PT Visit Diagnosis: Difficulty in walking, not elsewhere classified (R26.2)     Time: 1215-1224 PT Time Calculation (min) (ACUTE ONLY): 9 min  Charges:  $Gait Training: 8-22 mins $Therapeutic Exercise: 8-22 mins $Self Care/Home Management: 8-22                     Jacqueline Stout PT Acute Rehabilitation Services Pager [phone] Office [phone]    Rada Hay 12/11/2018, 12:33 PM

## 2018-12-11 NOTE — Discharge Summary (Signed)
Patient ID: Jacqueline Stout MRN: [id number] DOB/AGE: 1970-10-14 48 y.o.  Admit date: 12/10/2018 Discharge date: 12/11/2018  Admission Diagnoses:  Principal Problem:   Unilateral primary osteoarthritis, right hip Active Problems:   Status post total replacement of right hip   Discharge Diagnoses:  Same  Past Medical History:  Diagnosis Date  . Arthritis     Surgeries: Procedure(s): RIGHT TOTAL HIP ARTHROPLASTY ANTERIOR APPROACH on 12/10/2018   Consultants:   Discharged Condition: Improved  Hospital Course: Jacqueline Stout is an 48 y.o. female who was admitted 12/10/2018 for operative treatment ofUnilateral primary osteoarthritis, right hip. Patient has severe unremitting pain that affects sleep, daily activities, and work/hobbies. After pre-op clearance the patient was taken to the operating room on 12/10/2018 and underwent  Procedure(s): RIGHT TOTAL HIP ARTHROPLASTY ANTERIOR APPROACH.    Patient was given perioperative antibiotics:  Anti-infectives (From admission, onward)   Start     Dose/Rate Route Frequency Ordered Stop   12/10/18 1500  ceFAZolin (ANCEF) IVPB 1 g/50 mL premix     1 g 100 mL/hr over 30 Minutes Intravenous Every 6 hours 12/10/18 1346 12/10/18 2048   12/10/18 0630  ceFAZolin (ANCEF) IVPB 2g/100 mL premix     2 g 200 mL/hr over 30 Minutes Intravenous On call to O.R. 12/10/18 4268 12/10/18 3419       Patient was given sequential compression devices, early ambulation, and chemoprophylaxis to prevent DVT.  Patient benefited maximally from hospital stay and there were no complications.    Recent vital signs:  Patient Vitals for the past 24 hrs:  BP Temp Temp src Pulse Resp SpO2 Height Weight  12/11/18 0936 113/71 98.3 F (36.8 C) Oral 90 16 100 % - -  12/11/18 0435 122/81 98.2 F (36.8 C) Oral 66 16 100 % - -  12/11/18 0143 110/72 97.8 F (36.6 C) Oral 66 16 100 % - -  12/10/18 2154 108/72 98 F (36.7 C) Oral 66 16 100 % - -  12/10/18 1642  118/71 97.8 F (36.6 C) - 62 16 100 % - -  12/10/18 1549 115/76 (!) 97.5 F (36.4 C) Oral (!) 57 14 100 % - -  12/10/18 1457 112/73 (!) 97.5 F (36.4 C) Oral (!) 58 14 100 % - -  12/10/18 1343 107/73 (!) 97.4 F (36.3 C) Oral 70 14 100 % - -  12/10/18 1343 - - - - - - 5\' 4"  (1.626 m) 60 kg  12/10/18 1245 - - - - 14 - - -     Recent laboratory studies:  Recent Labs    12/11/18 0501  WBC 9.8  HGB 10.7*  HCT 32.5*  PLT 173  NA 138  K 3.9  CL 108  CO2 25  BUN 8  CREATININE 0.55  GLUCOSE 111*  CALCIUM 8.6*     Discharge Medications:   Allergies as of 12/11/2018      Reactions   Vicodin [hydrocodone-acetaminophen] Nausea And Vomiting      Medication List    TAKE these medications   aspirin 81 MG chewable tablet Chew 1 tablet (81 mg total) by mouth 2 (two) times daily.   multivitamin with minerals tablet Take 1 tablet by mouth daily.   SYNTHROID 100 MCG tablet Generic drug:  levothyroxine Take 100 mcg by mouth daily before breakfast.            Durable Medical Equipment  (From admission, onward)         Start  Ordered   12/10/18 1346  DME Walker rolling  Once    Question:  Patient needs a walker to treat with the following condition  Answer:  Status post total replacement of right hip   12/10/18 1346   12/10/18 1346  DME 3 n 1  Once     12/10/18 1346          Diagnostic Studies: Dg Pelvis Portable  Result Date: 12/10/2018 CLINICAL DATA:  Postop day 0 ANTERIOR approach RIGHT total hip arthroplasty. EXAM: PORTABLE PELVIS 1-2 VIEWS COMPARISON:  Intraoperative RIGHT hip x-rays earlier same day. RIGHT hip x-rays and AP pelvis 09/27/2018 and earlier. RIGHT hip MRI 06/28/2018. FINDINGS: Anatomic alignment of the prosthesis in the AP projection post ANTERIOR approach RIGHT total hip arthroplasty. No acute complicating features. Normal-appearing contralateral LEFT hip. IMPRESSION: Anatomic alignment in the AP projection post RIGHT total hip arthroplasty  without acute complicating features. Electronically Signed   By: Hulan Saas M.D.   On: 12/10/2018 12:58   Dg C-arm 1-60 Min-no Report  Result Date: 12/10/2018 Fluoroscopy was utilized by the requesting physician.  No radiographic interpretation.   Dg Hip Operative Unilat W Or W/o Pelvis Right  Result Date: 12/10/2018 CLINICAL DATA:  ANTERIOR approach RIGHT total hip arthroplasty performed for osteoarthritis. EXAM: OPERATIVE RIGHT HIP (WITH PELVIS IF PERFORMED) 1 VIEW TECHNIQUE: Fluoroscopic spot image(s) were submitted for interpretation post-operatively. COMPARISON:  09/27/2018. FINDINGS: RIGHT total hip arthroplasty with anatomic alignment of the prosthesis in the AP projection. IMPRESSION: RIGHT total hip arthroplasty with anatomic alignment of the prosthesis in the AP projection. Electronically Signed   By: Hulan Saas M.D.   On: 12/10/2018 10:36    Disposition: Discharge disposition: 01-Home or Self Care         Follow-up Information    Kathryne Hitch, MD Follow up in 2 week(s).   Specialty:  Orthopedic Surgery Contact information: [address] [place] [address] [phone]            Signed: Kathryne Hitch 12/11/2018, 12:27 PM

## 2018-12-11 NOTE — Progress Notes (Signed)
Physical Therapy Treatment Patient Details Name: Jacqueline Stout MRN: [id number] DOB: 08-13-1971 Today's Date: 12/11/2018    History of Present Illness s/p R DA THA    PT Comments    Patient is progressing well. Plans Dc today after PT/stair training. Will need RW and 3 in 1.  Follow Up Recommendations  Follow surgeon's recommendation for DC plan and follow-up therapies;Home health PT     Equipment Recommendations  Rolling walker with 5" wheels    Recommendations for Other Services       Precautions / Restrictions Precautions Precautions: Fall    Mobility  Bed Mobility Overal bed mobility: Needs Assistance Bed Mobility: Supine to Sit;Sit to Supine     Supine to sit: Min guard Sit to supine: Min guard   General bed mobility comments: instructed in use of the belt to self assist right leg and  technique to sit upright, then swing legs  Transfers Overall transfer level: Needs assistance Equipment used: Rolling walker (2 wheeled) Transfers: Sit to/from Stand Sit to Stand: Supervision         General transfer comment: cues for hand placement and RLE position;   Ambulation/Gait Ambulation/Gait assistance: Min guard Gait Distance (Feet): 200 Feet Assistive device: Rolling walker (2 wheeled) Gait Pattern/deviations: Step-through pattern     General Gait Details: smoothe reciprocal gait.   Stairs             Wheelchair Mobility    Modified Rankin (Stroke Patients Only)       Balance                                            Cognition Arousal/Alertness: Awake/alert                                            Exercises Total Joint Exercises Ankle Circles/Pumps: AROM;Both;10 reps Quad Sets: AROM;Both;10 reps Short Arc Quad: AROM;Right;10 reps;Supine Heel Slides: AROM Hip ABduction/ADduction: AAROM;Right;10 reps;Supine Long Arc Quad: AROM;Right;10 reps;Seated Marching in Standing: AAROM;Seated;Right;10  reps    General Comments        Pertinent Vitals/Pain Pain Score: 4  Pain Location: right hip Pain Descriptors / Indicators: Sore;Tightness Pain Intervention(s): Monitored during session    Home Living                      Prior Function            PT Goals (current goals can now be found in the care plan section) Progress towards PT goals: Progressing toward goals    Frequency    7X/week      PT Plan Current plan remains appropriate    Co-evaluation              AM-PAC PT "6 Clicks" Mobility   Outcome Measure  Help needed turning from your back to your side while in a flat bed without using bedrails?: A Little Help needed moving from lying on your back to sitting on the side of a flat bed without using bedrails?: A Little Help needed moving to and from a bed to a chair (including a wheelchair)?: A Little Help needed standing up from a chair using your arms (e.g., wheelchair or bedside chair)?: A Little Help needed to walk  in hospital room?: A Little Help needed climbing 3-5 steps with a railing? : A Lot 6 Click Score: 17    End of Session   Activity Tolerance: Patient tolerated treatment well Patient left: in chair;with call bell/phone within reach Nurse Communication: Mobility status PT Visit Diagnosis: Difficulty in walking, not elsewhere classified (R26.2)     Time: 3154-0086 PT Time Calculation (min) (ACUTE ONLY): 42 min  Charges:  $Gait Training: 8-22 mins $Therapeutic Exercise: 8-22 mins $Self Care/Home Management: 8-22                     Blanchard Kelch PT Acute Rehabilitation Services Pager [phone] Office [phone]    Rada Hay 12/11/2018, 10:15 AM

## 2018-12-13 DIAGNOSIS — E039 Hypothyroidism, unspecified: Secondary | ICD-10-CM | POA: Diagnosis not present

## 2018-12-13 DIAGNOSIS — Z96641 Presence of right artificial hip joint: Secondary | ICD-10-CM | POA: Diagnosis not present

## 2018-12-13 DIAGNOSIS — Z471 Aftercare following joint replacement surgery: Secondary | ICD-10-CM | POA: Diagnosis not present

## 2018-12-16 DIAGNOSIS — E039 Hypothyroidism, unspecified: Secondary | ICD-10-CM | POA: Diagnosis not present

## 2018-12-16 DIAGNOSIS — Z96641 Presence of right artificial hip joint: Secondary | ICD-10-CM | POA: Diagnosis not present

## 2018-12-16 DIAGNOSIS — Z471 Aftercare following joint replacement surgery: Secondary | ICD-10-CM | POA: Diagnosis not present

## 2018-12-17 ENCOUNTER — Encounter (HOSPITAL_COMMUNITY): Payer: Self-pay | Admitting: Orthopaedic Surgery

## 2018-12-17 DIAGNOSIS — Z96641 Presence of right artificial hip joint: Secondary | ICD-10-CM | POA: Diagnosis not present

## 2018-12-17 DIAGNOSIS — E039 Hypothyroidism, unspecified: Secondary | ICD-10-CM | POA: Diagnosis not present

## 2018-12-17 DIAGNOSIS — Z471 Aftercare following joint replacement surgery: Secondary | ICD-10-CM | POA: Diagnosis not present

## 2018-12-20 DIAGNOSIS — Z96641 Presence of right artificial hip joint: Secondary | ICD-10-CM | POA: Diagnosis not present

## 2018-12-20 DIAGNOSIS — E039 Hypothyroidism, unspecified: Secondary | ICD-10-CM | POA: Diagnosis not present

## 2018-12-20 DIAGNOSIS — Z471 Aftercare following joint replacement surgery: Secondary | ICD-10-CM | POA: Diagnosis not present

## 2018-12-22 DIAGNOSIS — Z471 Aftercare following joint replacement surgery: Secondary | ICD-10-CM | POA: Diagnosis not present

## 2018-12-22 DIAGNOSIS — E039 Hypothyroidism, unspecified: Secondary | ICD-10-CM | POA: Diagnosis not present

## 2018-12-22 DIAGNOSIS — Z96641 Presence of right artificial hip joint: Secondary | ICD-10-CM | POA: Diagnosis not present

## 2018-12-23 ENCOUNTER — Ambulatory Visit (INDEPENDENT_AMBULATORY_CARE_PROVIDER_SITE_OTHER): Payer: BLUE CROSS/BLUE SHIELD | Admitting: Orthopaedic Surgery

## 2018-12-23 ENCOUNTER — Encounter (INDEPENDENT_AMBULATORY_CARE_PROVIDER_SITE_OTHER): Payer: Self-pay | Admitting: Orthopaedic Surgery

## 2018-12-23 ENCOUNTER — Other Ambulatory Visit: Payer: Self-pay

## 2018-12-23 DIAGNOSIS — Z96641 Presence of right artificial hip joint: Secondary | ICD-10-CM

## 2018-12-23 DIAGNOSIS — Z471 Aftercare following joint replacement surgery: Secondary | ICD-10-CM | POA: Diagnosis not present

## 2018-12-23 DIAGNOSIS — E039 Hypothyroidism, unspecified: Secondary | ICD-10-CM | POA: Diagnosis not present

## 2018-12-23 NOTE — Progress Notes (Signed)
The patient is now 2 weeks tomorrow status post a right total hip arthroplasty.  She is ambulate with a cane and doing well.  She is only on Tylenol for pain and is been on aspirin twice a day.  She denies any calf pain and denies any foot and ankle swelling.  On examination her her dressing looks good to remove the dressing in place new Steri-Strips.  There is no significant seroma.  Swelling was to be expected.  Her leg lengths appear equal.  A long thorough discussion about activities.  She will continue out with a cane until she is comfortable.  She can return to work next week but to work from home for the first week.  We will then see her back in 4 weeks to see how she is doing overall.  I gave her a note as it relates to work restrictions.  If there are any issues before she sees [place] back in 4 weeks she will let us know.  At that next visit no x-rays are needed.

## 2019-01-14 ENCOUNTER — Telehealth (INDEPENDENT_AMBULATORY_CARE_PROVIDER_SITE_OTHER): Payer: Self-pay | Admitting: Orthopaedic Surgery

## 2019-01-14 ENCOUNTER — Encounter (INDEPENDENT_AMBULATORY_CARE_PROVIDER_SITE_OTHER): Payer: Self-pay

## 2019-01-14 NOTE — Telephone Encounter (Signed)
Patient request a video appointment if possible. Patient's callback # [phone]

## 2019-01-14 NOTE — Telephone Encounter (Signed)
I have sent patient a message on mychart

## 2019-01-19 ENCOUNTER — Telehealth (INDEPENDENT_AMBULATORY_CARE_PROVIDER_SITE_OTHER): Payer: Self-pay

## 2019-01-19 NOTE — Telephone Encounter (Signed)
Called and left message with patient to call back and confirm appointment tomorrow and to answer our screening questions 

## 2019-01-20 ENCOUNTER — Other Ambulatory Visit: Payer: Self-pay

## 2019-01-20 ENCOUNTER — Ambulatory Visit (INDEPENDENT_AMBULATORY_CARE_PROVIDER_SITE_OTHER): Payer: BLUE CROSS/BLUE SHIELD | Admitting: Orthopaedic Surgery

## 2019-01-20 ENCOUNTER — Encounter (INDEPENDENT_AMBULATORY_CARE_PROVIDER_SITE_OTHER): Payer: Self-pay | Admitting: Orthopaedic Surgery

## 2019-01-20 DIAGNOSIS — Z96641 Presence of right artificial hip joint: Secondary | ICD-10-CM

## 2019-01-20 NOTE — Progress Notes (Signed)
HPI: Jacqueline Stout returns now 6 weeks status post right total hip arthroplasty.  She is overall doing very well.  She has no complaints still some stiffness whenever she first begins to ambulate.  Physical exam: Right hip good range of motion without pain calf supple nontender dorsiflexion plantarflexion ankle intact.  Ambulates without any assistive device and a nonantalgic gait.  Impression: Status post right total hip arthroplasty 12/10/2018  Plan: She will continue to work on strength range of motion right hip.  Work on Social research officer, government.  She will follow-up with [place] at 1 year postop and at that time we will obtain an AP pelvis and lateral view of the right hip.  She will follow-up sooner if there is any questions or concerns.  Questions were encouraged and answered at length today.

## 2019-06-02 DIAGNOSIS — I781 Nevus, non-neoplastic: Secondary | ICD-10-CM | POA: Diagnosis not present

## 2019-06-02 DIAGNOSIS — D225 Melanocytic nevi of trunk: Secondary | ICD-10-CM | POA: Diagnosis not present

## 2019-06-02 DIAGNOSIS — Z85828 Personal history of other malignant neoplasm of skin: Secondary | ICD-10-CM | POA: Diagnosis not present

## 2019-11-22 ENCOUNTER — Ambulatory Visit (INDEPENDENT_AMBULATORY_CARE_PROVIDER_SITE_OTHER): Payer: BC Managed Care – PPO | Admitting: Orthopaedic Surgery

## 2019-11-22 ENCOUNTER — Encounter: Payer: Self-pay | Admitting: Orthopaedic Surgery

## 2019-11-22 ENCOUNTER — Ambulatory Visit: Payer: Self-pay

## 2019-11-22 ENCOUNTER — Other Ambulatory Visit: Payer: Self-pay

## 2019-11-22 DIAGNOSIS — Z96641 Presence of right artificial hip joint: Secondary | ICD-10-CM

## 2019-11-22 NOTE — Progress Notes (Signed)
   Office Visit Note   Patient: Jacqueline Stout           Date of Birth: [birth year]           MRN: [id number] Visit Date: 11/22/2019              Requested by: Ozella Rocks, MD [address] [place],  [place] [address] PCP: Ozella Rocks, MD   Assessment & Plan: Visit Diagnoses:  1. Status post total replacement of right hip     Plan: She will follow-up as needed.  Questions encouraged and answered by Dr. Magnus Ivan and myself.  Follow-Up Instructions: Return if symptoms worsen or fail to improve.   Orders:  Orders Placed This Encounter  Procedures  . XR HIP UNILAT W OR W/O PELVIS 2-3 VIEWS RIGHT   No orders of the defined types were placed in this encounter.     Procedures: No procedures performed   Clinical Data: No additional findings.   Subjective: Chief Complaint  Patient presents with  . Right Hip - Follow-up    HPI Jacqueline Stout returns today 1 year status post right total hip arthroplasty.  She is overall doing very well.  States the knee feels natural.  She is running again about 3 to 4 miles 3 times a week.  She has no complaints. Review of Systems Negative for fevers, chills, shortness of breath, chest pain  Objective: Vital Signs: There were no vitals taken for this visit.  Physical Exam General: Well-developed well-nourished female no acute distress Psych: Alert and oriented x3 Ortho Exam Right hip excellent range of motion without pain.  Ambulates with a normal antalgic gait without the of an assistance device. Specialty Comments:  No specialty comments available.  Imaging: XR HIP UNILAT W OR W/O PELVIS 2-3 VIEWS RIGHT  Result Date: 11/22/2019 AP pelvis and lateral view right hip: No acute fractures.  Right total hip arthroplasty components well-seated no signs of complication.  Left hip is well preserved.  Both hips are located.    PMFS History: Patient Active Problem List   Diagnosis Date Noted  . Status post total  replacement of right hip 12/10/2018  . Unilateral primary osteoarthritis, right hip 11/10/2018  . Stress fracture of right hip 08/26/2018  . Stress fracture of hip 07/01/2018  . Hypothyroidism 06/29/2018  . Right hip pain 06/28/2018   Past Medical History:  Diagnosis Date  . Arthritis     History reviewed. No pertinent family history.  Past Surgical History:  Procedure Laterality Date  . DILATION AND CURETTAGE OF UTERUS    . TOTAL HIP ARTHROPLASTY Right 12/10/2018   Procedure: RIGHT TOTAL HIP ARTHROPLASTY ANTERIOR APPROACH;  Surgeon: Kathryne Hitch, MD;  Location: [facility];  Service: Orthopedics;  Laterality: Right;  . WRIST SURGERY     cyst on left wrist   Social History   Occupational History  . Not on file  Tobacco Use  . Smoking status: Never Smoker  . Smokeless tobacco: Never Used  Substance and Sexual Activity  . Alcohol use: Yes    Comment: occasionally  . Drug use: Never  . Sexual activity: Not on file

## 2019-12-30 ENCOUNTER — Ambulatory Visit: Payer: BC Managed Care – PPO | Attending: Internal Medicine

## 2019-12-30 DIAGNOSIS — Z23 Encounter for immunization: Secondary | ICD-10-CM

## 2019-12-30 NOTE — Progress Notes (Signed)
   Covid-19 Vaccination Clinic  Name:  Jacqueline Stout    MRN: [id number] DOB: June 14, 1971  12/30/2019  Jacqueline Stout was observed post Covid-19 immunization for 15 minutes without incident. She was provided with Vaccine Information Sheet and instruction to access the V-Safe system.   Jacqueline Stout was instructed to call 911 with any severe reactions post vaccine: Marland Kitchen Difficulty breathing  . Swelling of face and throat  . A fast heartbeat  . A bad rash all over body  . Dizziness and weakness   Immunizations Administered    Name Date Dose VIS Date Route   Pfizer COVID-19 Vaccine 12/30/2019  8:17 AM 0.3 mL 09/23/2019 Intramuscular   Manufacturer: [employer], [employer]   Lot: [id number]   NDC: [address]-1

## 2020-01-24 ENCOUNTER — Ambulatory Visit: Payer: BC Managed Care – PPO | Attending: Internal Medicine

## 2020-01-24 DIAGNOSIS — Z23 Encounter for immunization: Secondary | ICD-10-CM

## 2020-01-24 NOTE — Progress Notes (Signed)
   Covid-19 Vaccination Clinic  Name:  Jacqueline Stout    MRN: [id number] DOB: 1971-09-25  01/24/2020  [name] was observed post Covid-19 immunization for 15 minutes without incident. She was provided with Vaccine Information Sheet and instruction to access the V-Safe system.   [name] was instructed to call 911 with any severe reactions post vaccine: Marland Kitchen Difficulty breathing  . Swelling of face and throat  . A fast heartbeat  . A bad rash all over body  . Dizziness and weakness   Immunizations Administered    Name Date Dose VIS Date Route   Pfizer COVID-19 Vaccine 01/24/2020  8:57 AM 0.3 mL 09/23/2019 Intramuscular   Manufacturer: [employer], [employer]   Lot: [id number]   NDC: [address]-2

## 2020-08-21 ENCOUNTER — Encounter: Payer: Self-pay | Admitting: Gastroenterology

## 2020-09-15 IMAGING — MR MR HIP*R* W/CM
5 series · 40 of 40 positions shown · IV contrast (multihance)
Comparison: Radiographs from 06/18/2018

CLINICAL DATA: Right hip pain for 2 months

EXAM:
MRI OF THE RIGHT HIP WITH CONTRAST
TECHNIQUE: Multiplanar, multisequence MR imaging was performed following the
administration of intravenous contrast.
CONTRAST:  1mL MULTIHANCE GADOBENATE DIMEGLUMINE 529 MG/ML IV SOLN

[Series 3: T1 · coronal · 4.0mm · 0.80mm/px · 9 of 35 slices shown]
[im 1/35]
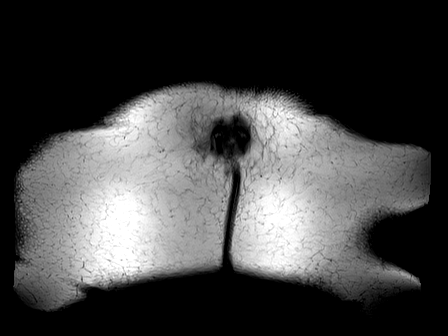
[im 5/35]
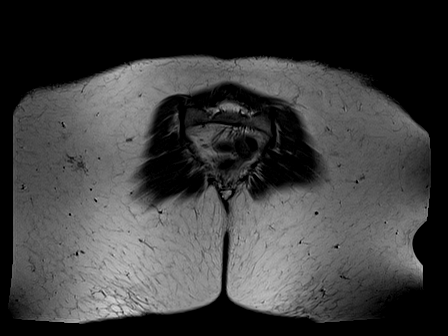
[im 9/35]
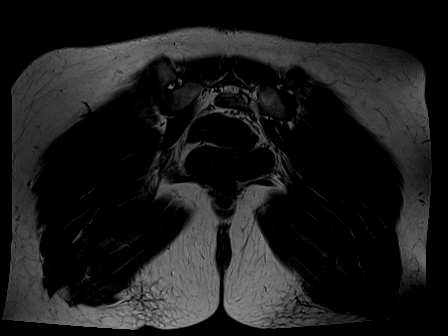
[im 13/35]
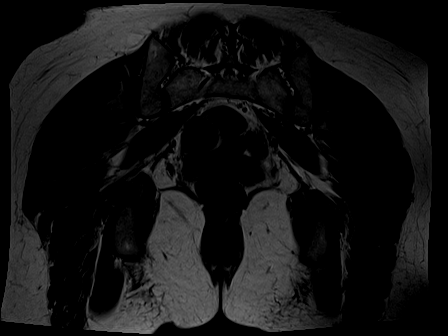
[im 18/35]
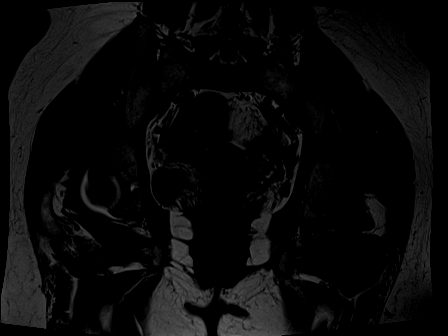
[im 22/35]
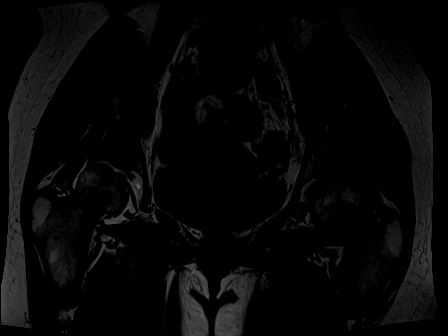
[im 26/35]
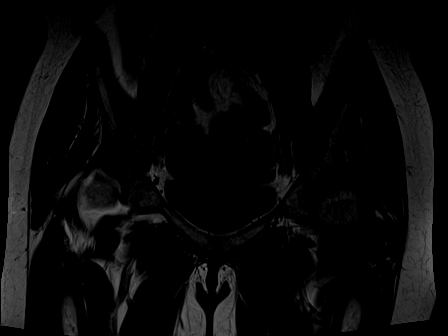
[im 30/35]
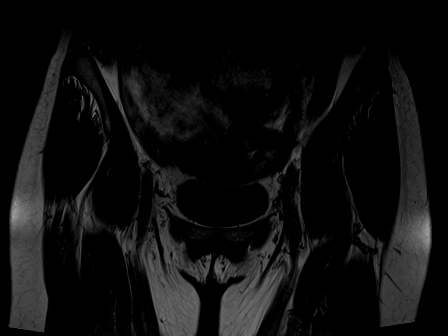
[im 35/35]
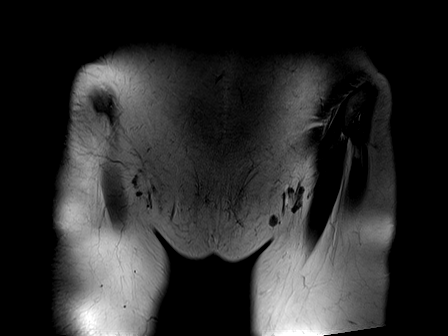

[Series 4: T2 fat-sat · coronal · 4.0mm · 0.94mm/px · 9 of 35 slices shown]
[im 1/35]
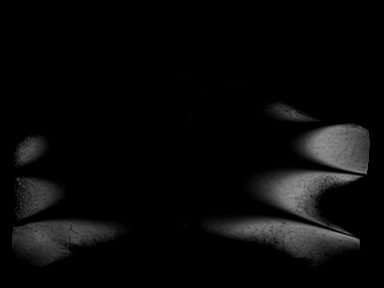
[im 5/35]
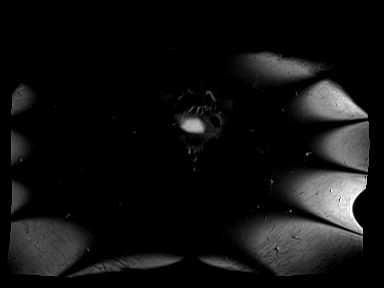
[im 9/35]
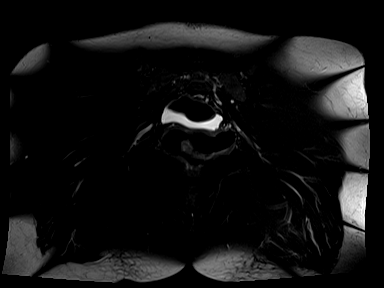
[im 13/35]
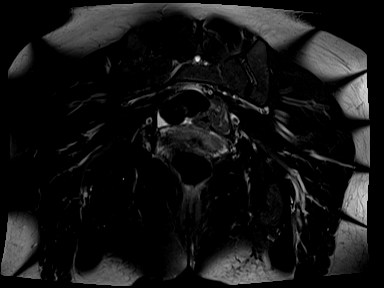
[im 18/35]
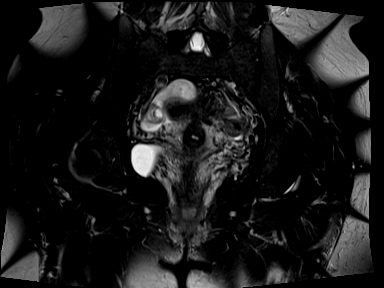
[im 22/35]
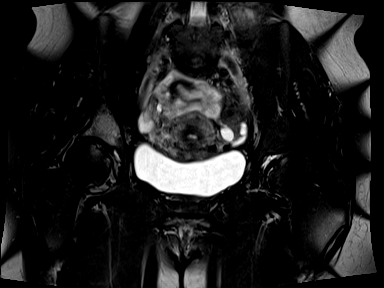
[im 26/35]
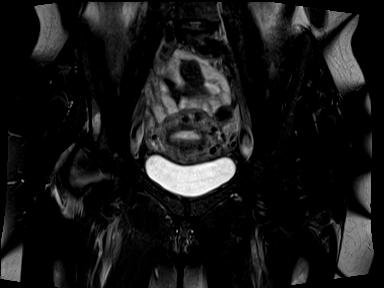
[im 30/35]
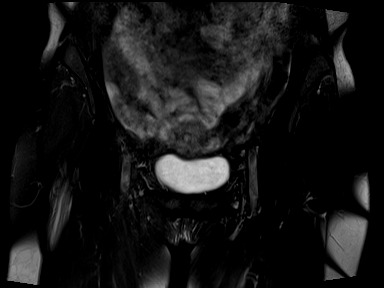
[im 35/35]
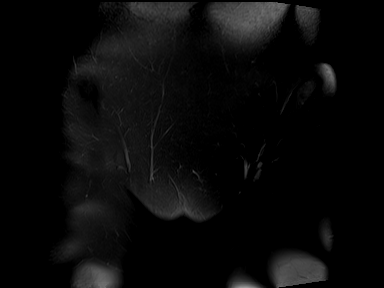

[Series 5: T1 fat-sat · axial · 4.0mm · 0.47mm/px · z∈[-107,-2]mm · 7 of 25 slices shown (1 of 3)]
[im 1/25]
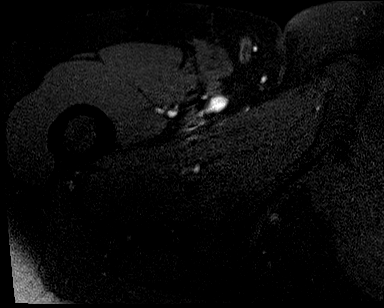
[im 5/25]
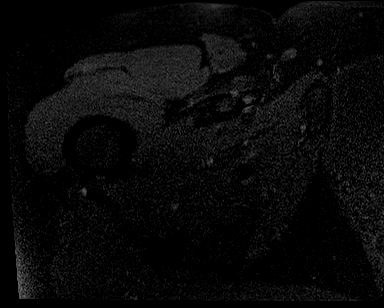
[im 9/25]
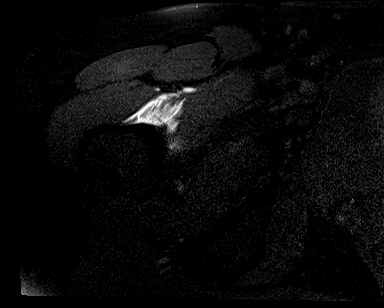
[im 13/25]
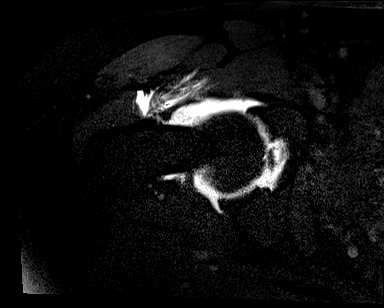
[im 17/25]
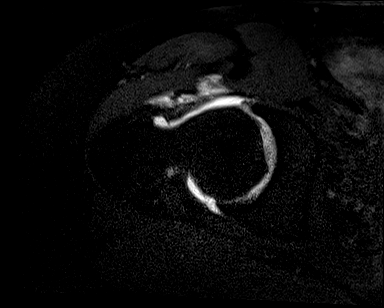
[im 21/25]
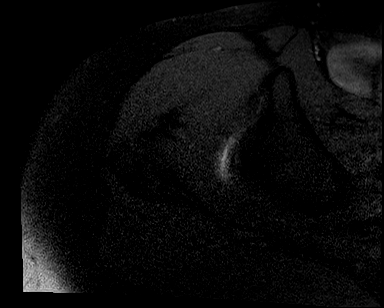
[im 25/25]
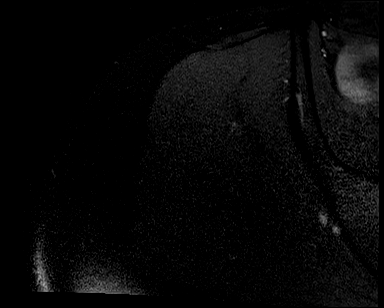

[Series 6: T1 fat-sat · sagittal · 4.0mm · 0.56mm/px · 8 of 28 slices shown (2 of 3)]
[im 1/28]
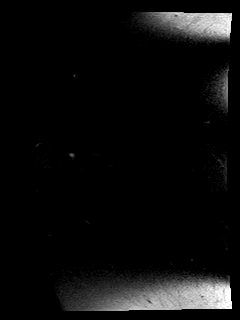
[im 4/28]
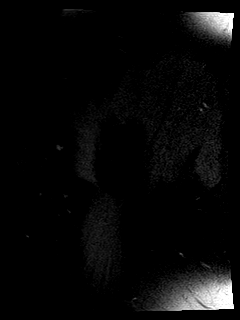
[im 8/28]
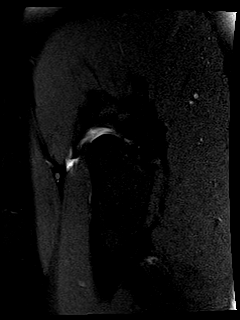
[im 12/28]
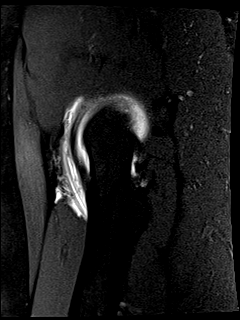
[im 16/28]
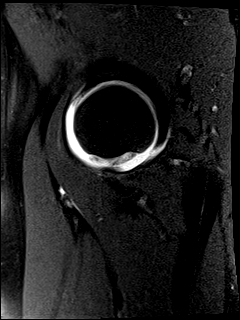
[im 20/28]
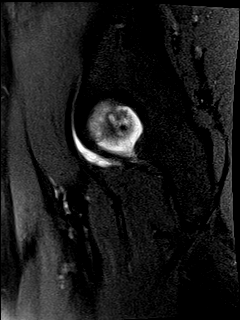
[im 24/28]
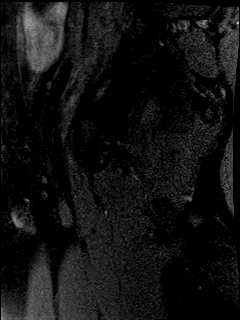
[im 28/28]
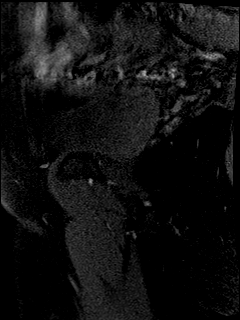

[Series 7: T1 fat-sat · coronal · 4.0mm · 0.56mm/px · 7 of 25 slices shown (3 of 3)]
[im 1/25]
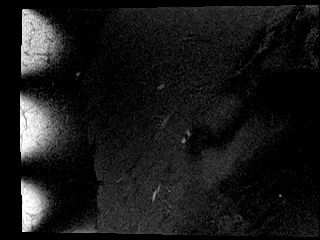
[im 5/25]
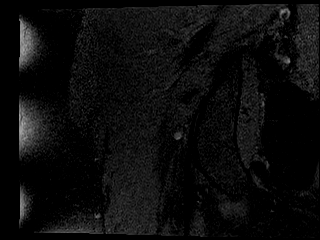
[im 9/25]
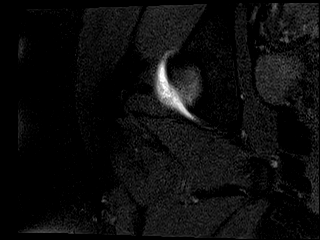
[im 13/25]
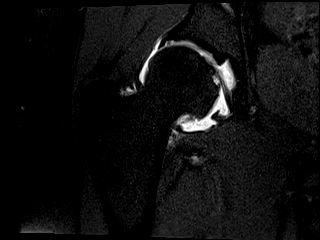
[im 17/25]
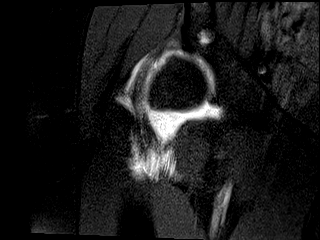
[im 21/25]
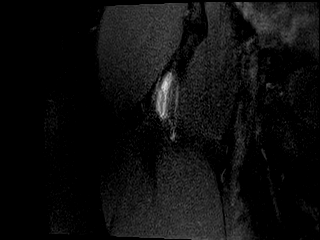
[im 25/25]
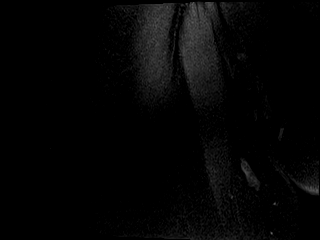

[40 of 40 positions shown; findings below may reference images not displayed]

FINDINGS: Bones: There is notable marrow edema along the right acetabular roof
especially centrally and anteriorly, primarily centered around a
by 1.7 cm geode which has high T1 signal probably representing
contrast medium from the joint extending into the geode.

There is spurring of the femoral heads, right greater than left.
Cortical irregularity in the right femoral head just above the full
Dainan Peque compatible with an osteochondral lesion about 9 mm in diameter
with associated marrow edema for example on images 22-23 of series
4. Overlying chondral defect in this vicinity on images 14-15 of
series 7. There is subtle edema in analogous portion on the
contralateral (left) femoral head just above the 5 a.m..

Articular cartilage and labrum

Articular cartilage: There is mild to moderate articular cartilage
thinning in the right hip greater, greater than I would have
expected based on the conventional radiographs. There is also
irregularity and mild fissuring in the acetabular articular
cartilage, for example as shown on image [DATE].

Labrum: There is some mild surface irregularity of the superior and
anterior labrum suggesting fraying, but without extension all the
way to the periphery of the labrum.

Joint, bursal effusion

Joint:  No definite free fragment or overt synovitis.

Bursae: No regional bursitis.

Muscles and tendons

Muscles and tendons:  Unremarkable

Other findings

Miscellaneous: Small amount of free pelvic fluid. 1.9 cm cyst or
follicle of the right ovary.
IMPRESSION: 1. Arthropathy of the right hip including a notable geode in the
right acetabulum with considerable surrounding marrow edema; an
osteochondral lesion of the right femoral head just above the 5A a
with articular cartilage loss, mild cortical irregularity, and
subcortical marrow edema.
2. There is suspected fraying of the superior and anterior
acetabular labrum, but without a full tear extending through to the
periphery.
3. Small amount of nonspecific free pelvic fluid. This may be
physiologic. There is also a 1.9 cm cyst or follicle of the right
ovary.

## 2020-10-09 ENCOUNTER — Other Ambulatory Visit: Payer: Self-pay

## 2020-10-09 ENCOUNTER — Ambulatory Visit (AMBULATORY_SURGERY_CENTER): Payer: Self-pay | Admitting: *Deleted

## 2020-10-09 VITALS — Ht 64.0 in | Wt 128.0 lb

## 2020-10-09 DIAGNOSIS — Z1211 Encounter for screening for malignant neoplasm of colon: Secondary | ICD-10-CM

## 2020-10-09 MED ORDER — PLENVU 140 G PO SOLR: 1.0000 | ORAL | 0 refills | Status: DC

## 2020-10-09 NOTE — Progress Notes (Signed)
No egg or soy allergy known to patient  No issues with past sedation with any surgeries or procedures No intubation problems in the past  No FH of Malignant Hyperthermia No diet pills per patient No home 02 use per patient  No blood thinners per patient  Pt denies issues with constipation  No A fib or A flutter  EMMI video to pt or via MyChart  COVID 19 guidelines implemented in [facility] today with Pt and RN  Pt is fully vaccinated  for Covid   Plenvu  Coupon given to pt in [facility] today , Code to Pharmacy   Due to the COVID-19 pandemic we are asking patients to follow certain guidelines.  Pt aware of COVID protocols and [facility] guidelines   

## 2020-10-17 ENCOUNTER — Encounter: Payer: Self-pay | Admitting: Gastroenterology

## 2020-10-23 ENCOUNTER — Other Ambulatory Visit: Payer: Self-pay

## 2020-10-23 ENCOUNTER — Ambulatory Visit (AMBULATORY_SURGERY_CENTER): Payer: 59 | Admitting: Gastroenterology

## 2020-10-23 ENCOUNTER — Encounter: Payer: Self-pay | Admitting: Gastroenterology

## 2020-10-23 VITALS — BP 106/59 | HR 53 | Temp 98.6°F | Resp 11 | Ht 64.0 in | Wt 128.0 lb

## 2020-10-23 DIAGNOSIS — Z1212 Encounter for screening for malignant neoplasm of rectum: Secondary | ICD-10-CM

## 2020-10-23 DIAGNOSIS — Z1211 Encounter for screening for malignant neoplasm of colon: Secondary | ICD-10-CM | POA: Diagnosis not present

## 2020-10-23 HISTORY — PX: COLONOSCOPY: SHX174

## 2020-10-23 MED ORDER — SODIUM CHLORIDE 0.9 % IV SOLN: 500.0000 mL | Freq: Once | INTRAVENOUS | Status: DC

## 2020-10-23 NOTE — Progress Notes (Signed)
Pt's states no medical or surgical changes since previsit or office visit.  ° °Vitals CW °

## 2020-10-23 NOTE — Progress Notes (Signed)
pt tolerated well. VSS. awake and to recovery. Report given to RN.  

## 2020-10-23 NOTE — Op Note (Signed)
[facility] Patient Name: Jacqueline Stout Procedure Date: 10/23/2020 9:28 AM MRN: [id number] Endoscopist: Meryl Dare , MD Age: 50 Referring MD:  Date of Birth: 1971/05/06 Gender: Female Account #: [id number] Procedure:                Colonoscopy Indications:              Screening for colorectal malignant neoplasm Medicines:                Monitored Anesthesia Care Procedure:                Pre-Anesthesia Assessment:                           - Prior to the procedure, a History and Physical                            was performed, and patient medications and                            allergies were reviewed. The patient's tolerance of                            previous anesthesia was also reviewed. The risks                            and benefits of the procedure and the sedation                            options and risks were discussed with the patient.                            All questions were answered, and informed consent                            was obtained. Prior Anticoagulants: The patient has                            taken no previous anticoagulant or antiplatelet                            agents. ASA Grade Assessment: II - A patient with                            mild systemic disease. After reviewing the risks                            and benefits, the patient was deemed in                            satisfactory condition to undergo the procedure.                           After obtaining informed consent, the colonoscope  was passed under direct vision. Throughout the                            procedure, the patient's blood pressure, pulse, and                            oxygen saturations were monitored continuously. The                            Olympus PFC-[id number]) Colonoscope was                            introduced through the anus and advanced to the the                            cecum,  identified by appendiceal orifice and                            ileocecal valve. The ileocecal valve, appendiceal                            orifice, and rectum were photographed. The quality                            of the bowel preparation was excellent. The                            colonoscopy was performed without difficulty. The                            patient tolerated the procedure well. Scope In: 9:37:38 AM Scope Out: 9:54:38 AM Scope Withdrawal Time: 0 hours 12 minutes 42 seconds  Total Procedure Duration: 0 hours 17 minutes 0 seconds  Findings:                 The perianal and digital rectal examinations were                            normal.                           A few small-mouthed diverticula were found in the                            sigmoid colon.                           Internal hemorrhoids were found during                            retroflexion. The hemorrhoids were small and Grade                            I (internal hemorrhoids that do not prolapse).  The exam was otherwise without abnormality on                            direct and retroflexion views. Complications:            No immediate complications. Estimated blood loss:                            None. Estimated Blood Loss:     Estimated blood loss: none. Impression:               - Mild diverticulosis in the sigmoid colon.                           - Internal hemorrhoids.                           - The examination was otherwise normal on direct                            and retroflexion views.                           - No specimens collected. Recommendation:           - Repeat colonoscopy in 10 years for screening                            purposes.                           - Patient has a contact number available for                            emergencies. The signs and symptoms of potential                            delayed complications were discussed  with the                            patient. Return to normal activities tomorrow.                            Written discharge instructions were provided to the                            patient.                           - High fiber diet.                           - Continue present medications. Meryl Dare, MD 10/23/2020 9:59:35 AM This report has been signed electronically.

## 2020-10-23 NOTE — Patient Instructions (Signed)
Please, read all of your discharge instructions.  You may resume any previous medications.  YOU HAD AN ENDOSCOPIC PROCEDURE TODAY AT THE [facility]:   Refer to the procedure report that was given to you for any specific questions about what was found during the examination.  If the procedure report does not answer your questions, please call your gastroenterologist to clarify.  If you requested that your care partner not be given the details of your procedure findings, then the procedure report has been included in a sealed envelope for you to review at your convenience later.  YOU SHOULD EXPECT: Some feelings of bloating in the abdomen. Passage of more gas than usual.  Walking can help get rid of the air that was put into your GI tract during the procedure and reduce the bloating. If you had a lower endoscopy (such as a colonoscopy or flexible sigmoidoscopy) you may notice spotting of blood in your stool or on the toilet paper. If you underwent a bowel prep for your procedure, you may not have a normal bowel movement for a few days.  Please Note:  You might notice some irritation and congestion in your nose or some drainage.  This is from the oxygen used during your procedure.  There is no need for concern and it should clear up in a day or so.  SYMPTOMS TO REPORT IMMEDIATELY:   Following lower endoscopy (colonoscopy or flexible sigmoidoscopy):  Excessive amounts of blood in the stool  Significant tenderness or worsening of abdominal pains  Swelling of the abdomen that is new, acute  Fever of 100F or higher    For urgent or emergent issues, a gastroenterologist can be reached at any hour by calling [phone]. Do not use MyChart messaging for urgent concerns.    DIET:  We do recommend a small meal at first, but then you may proceed to your regular diet.  Drink plenty of fluids but you should avoid alcoholic beverages for 24 hours. Try to eat a high fiber diet, and drink  plenty of water.  ACTIVITY:  You should plan to take it easy for the rest of today and you should NOT DRIVE or use heavy machinery until tomorrow (because of the sedation medicines used during the test).    FOLLOW UP: Our staff will call the number listed on your records 48-72 hours following your procedure to check on you and address any questions or concerns that you may have regarding the information given to you following your procedure. If we do not reach you, we will leave a message.  We will attempt to reach you two times.  During this call, we will ask if you have developed any symptoms of COVID 19. If you develop any symptoms (ie: fever, flu-like symptoms, shortness of breath, cough etc.) before then, please call [phone].  If you test positive for Covid 19 in the 2 weeks post procedure, please call and report this information to [place].     SIGNATURES/CONFIDENTIALITY: You and/or your care partner have signed paperwork which will be entered into your electronic medical record.  These signatures attest to the fact that that the information above on your After Visit Summary has been reviewed and is understood.  Full responsibility of the confidentiality of this discharge information lies with you and/or your care-partner.

## 2020-10-25 ENCOUNTER — Telehealth: Payer: Self-pay

## 2020-10-25 NOTE — Telephone Encounter (Signed)
Left message on follow up call. 

## 2020-10-25 NOTE — Telephone Encounter (Signed)
  Follow up Call-  Call back number 10/23/2020  Post procedure Call Back phone  # [phone]  Permission to leave phone message Yes  Some recent data might be hidden     1st follow up call made. NALM
